# Patient Record
Sex: Male | Born: 1990 | Race: Black or African American | Hispanic: No | Marital: Single | Smoking: Never smoker
Health system: Southern US, Community
[De-identification: ages and names within clinical notes are randomized; demographics above are authoritative.]

## PROBLEM LIST (undated history)

## (undated) DIAGNOSIS — J302 Other seasonal allergic rhinitis: Secondary | ICD-10-CM

---
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
## 2011-11-05 ENCOUNTER — Encounter (HOSPITAL_COMMUNITY): Payer: Self-pay

## 2011-11-05 ENCOUNTER — Emergency Department (HOSPITAL_COMMUNITY)
Admission: EM | Admit: 2011-11-05 | Discharge: 2011-11-05 | Disposition: A | Discharge status: Home / Self Care | Payer: Managed Care, Other (non HMO) | Attending: Emergency Medicine | Admitting: Emergency Medicine

## 2011-11-05 DIAGNOSIS — J45909 Unspecified asthma, uncomplicated: Secondary | ICD-10-CM | POA: Insufficient documentation

## 2011-11-05 DIAGNOSIS — R5381 Other malaise: Secondary | ICD-10-CM | POA: Insufficient documentation

## 2011-11-05 DIAGNOSIS — R51 Headache: Secondary | ICD-10-CM | POA: Insufficient documentation

## 2011-11-05 DIAGNOSIS — J3489 Other specified disorders of nose and nasal sinuses: Secondary | ICD-10-CM | POA: Insufficient documentation

## 2011-11-05 DIAGNOSIS — J329 Chronic sinusitis, unspecified: Secondary | ICD-10-CM | POA: Insufficient documentation

## 2011-11-05 MED ORDER — AZITHROMYCIN 500 MG PO TABS: 500.0000 mg | ORAL_TABLET | Freq: Every day | ORAL | Status: AC

## 2011-11-05 NOTE — Discharge Instructions (Signed)
Please follow-up with a primary care physician:       Contact your insurance Counselling psychologist) and ask who participates with your insurance in the [place] area;       Call [facility] [phone]) to ask for physicians who participate with your [employer] insurance.  Konrad Felix RN, MSN, Case Manager          Sinusitis Sinuses are air pockets within the bones of your face. The growth of bacteria within a sinus leads to infection. The infection prevents the sinuses from draining. This infection is called sinusitis. SYMPTOMS  There will be different areas of pain depending on which sinuses have become infected.  The maxillary sinuses often produce pain beneath the eyes.   Frontal sinusitis may cause pain in the middle of the forehead and above the eyes.  Other problems (symptoms) include:  Toothaches.   Colored, pus-like (purulent) drainage from the nose.   Swelling, warmth, and tenderness over the sinus areas may be signs of infection.  TREATMENT  Sinusitis is most often determined by an exam.X-rays may be taken. If x-rays have been taken, make sure you obtain your results or find out how you are to obtain them. Your caregiver may give you medications (antibiotics). These are medications that will help kill the bacteria causing the infection. You may also be given a medication (decongestant) that helps to reduce sinus swelling.  HOME CARE INSTRUCTIONS   Only take over-the-counter or prescription medicines for pain, discomfort, or fever as directed by your caregiver.   Drink extra fluids. Fluids help thin the mucus so your sinuses can drain more easily.   Applying either moist heat or ice packs to the sinus areas may help relieve discomfort.   Use saline nasal sprays to help moisten your sinuses. The sprays can be found at your local drugstore.  SEEK IMMEDIATE MEDICAL CARE IF:  You have a fever.   You have increasing pain, severe headaches, or toothache.   You have nausea,  vomiting, or drowsiness.   You develop unusual swelling around the face or trouble seeing.  MAKE SURE YOU:   Understand these instructions.   Will watch your condition.   Will get help right away if you are not doing well or get worse.  Document Released: 07/09/2005 Document Revised: 06/28/2011 Document Reviewed: 02/05/2007 [facility] Patient Information 2012 [place], [place].

## 2011-11-05 NOTE — ED Notes (Signed)
Patient presents with nasal congestion x 2 weeks with weakness beginning yesterday. Patient denies cough, nausea, vomiting, diarrhea or SOB

## 2011-11-05 NOTE — Progress Notes (Signed)
Met with patient to discuss his health care practices. Patient states that he does not have a primary care physician; however, he does have  [place] insurance. Patient was offered the choice of me making an appointment or patient scheduling once he is discharged. Patient chose to make the contacts himself. I wrote instructions in the discharge.

## 2011-11-05 NOTE — ED Notes (Signed)
Orthostatics completed. Pt tolerated well. No complaint of dizziness. Pt states he told pa he had a little nausea yesterday

## 2011-11-05 NOTE — ED Provider Notes (Signed)
Medical screening examination/treatment/procedure(s) were performed by non-physician practitioner and as supervising physician I was immediately available for consultation/collaboration.   Joya Gaskins, MD 11/05/11 [phone]

## 2011-11-05 NOTE — ED Provider Notes (Signed)
History     CSN: [id number]  Arrival date & time 11/05/11  1033   First MD Initiated Contact with Patient 11/05/11 1100      Chief Complaint  Patient presents with  . Nasal Congestion    (Consider location/radiation/quality/duration/timing/severity/associated sxs/prior treatment) The history is provided by the patient.   the patient is a 21 year old male with a past medical history of asthma who presents to the emergency department with 2 weeks of nasal congestion with associated facial pressure and pain. He reports that yesterday, he began to have weakness described as feeling "woozy"when he stands only, improving after he has been standing for several minutes. He denies any fever, chills, ear pain, eye pain, change in vision, sore throat, cough, shortness of breath, chest pain, nausea, vomiting, diarrhea, abdominal pain. Nothing makes the symptoms better or worse. He has tried no medications for his symptoms and has had no prior medical evaluation.  Past Medical History  Diagnosis Date  . Asthma     History reviewed. No pertinent past surgical history.  No family history on file.  History  Substance Use Topics  . Smoking status: Current Everyday Smoker  . Smokeless tobacco: Not on file  . Alcohol Use: No      Review of Systems 10 systems reviewed and are negative for acute change except as noted in the HPI.  Allergies  Review of patient's allergies indicates no known allergies.  Home Medications  No current outpatient prescriptions on file.  BP 119/77  Pulse 96  Temp(Src) 98.8 F (37.1 C) (Oral)  Resp 16  SpO2 99%  Physical Exam  Nursing note and vitals reviewed. Constitutional: He is oriented to person, place, and time. He appears well-developed and well-nourished. No distress.  HENT:  Head: Normocephalic and atraumatic.  Right Ear: Hearing, tympanic membrane, external ear and ear canal normal.  Left Ear: Hearing, tympanic membrane, external ear and ear  canal normal.  Nose: Mucosal edema present. No sinus tenderness or septal deviation. Right sinus exhibits maxillary sinus tenderness. Right sinus exhibits no frontal sinus tenderness. Left sinus exhibits maxillary sinus tenderness. Left sinus exhibits no frontal sinus tenderness.  Mouth/Throat: Uvula is midline, oropharynx is clear and moist and mucous membranes are normal.  Eyes: Conjunctivae are normal. Pupils are equal, round, and reactive to light.  Neck: Normal range of motion. Neck supple.  Cardiovascular: Normal rate, regular rhythm, normal heart sounds and intact distal pulses.   No murmur heard. Pulmonary/Chest: Effort normal and breath sounds normal. No respiratory distress. He has no wheezes. He exhibits no tenderness.  Abdominal: Soft. He exhibits no distension. There is no tenderness.  Musculoskeletal: He exhibits no edema and no tenderness.  Lymphadenopathy:    He has no cervical adenopathy.  Neurological: He is alert and oriented to person, place, and time.  Skin: Skin is warm and dry.  Psychiatric: He has a normal mood and affect.    ED Course  Procedures (including critical care time)  Labs Reviewed - No data to display No results found.   Dx 1: Sinusitis   MDM  Pt with exam c/w sinusitis. Although not severe, duration of symptoms >10 days warrants abx treatment. Also discussed with pt the use of a decongestant, which he will try. Mild orthostasis, not significant enough to administer IV fluids.        Shaaron Adler, [place] 11/05/11 1304

## 2012-03-03 ENCOUNTER — Emergency Department (HOSPITAL_COMMUNITY)
Admission: EM | Admit: 2012-03-03 | Discharge: 2012-03-04 | Disposition: A | Discharge status: Home / Self Care | Payer: Managed Care, Other (non HMO) | Attending: Emergency Medicine | Admitting: Emergency Medicine

## 2012-03-03 ENCOUNTER — Emergency Department (HOSPITAL_COMMUNITY): Payer: Managed Care, Other (non HMO)

## 2012-03-03 ENCOUNTER — Encounter (HOSPITAL_COMMUNITY): Payer: Self-pay | Admitting: *Deleted

## 2012-03-03 DIAGNOSIS — Y9367 Activity, basketball: Secondary | ICD-10-CM | POA: Insufficient documentation

## 2012-03-03 DIAGNOSIS — M25539 Pain in unspecified wrist: Secondary | ICD-10-CM | POA: Insufficient documentation

## 2012-03-03 DIAGNOSIS — M25519 Pain in unspecified shoulder: Secondary | ICD-10-CM | POA: Insufficient documentation

## 2012-03-03 DIAGNOSIS — X58XXXA Exposure to other specified factors, initial encounter: Secondary | ICD-10-CM | POA: Insufficient documentation

## 2012-03-03 DIAGNOSIS — S63509A Unspecified sprain of unspecified wrist, initial encounter: Secondary | ICD-10-CM | POA: Insufficient documentation

## 2012-03-03 NOTE — ED Notes (Signed)
Pt states he was playing basketball, fell and landed on left arm. Pt c/o left shoulder, arm, and wrist pain. No obvious deformity noted. Bounding left radial pulse. Pt guarding left arm.

## 2012-03-04 MED ORDER — HYDROCODONE-ACETAMINOPHEN 5-500 MG PO TABS: 1.0000 | ORAL_TABLET | Freq: Four times a day (QID) | ORAL | Status: AC | PRN

## 2012-03-04 MED ORDER — OXYCODONE-ACETAMINOPHEN 5-325 MG PO TABS
1.0000 | ORAL_TABLET | Freq: Once | ORAL | Status: AC
  Administered 2012-03-04: 1 via ORAL
  Filled 2012-03-04: qty 1

## 2012-03-04 NOTE — ED Provider Notes (Signed)
Medical screening examination/treatment/procedure(s) were performed by non-physician practitioner and as supervising physician I was immediately available for consultation/collaboration.  Indy Kuck M Arrie Zuercher, MD 03/04/12 0822 

## 2012-03-04 NOTE — ED Provider Notes (Signed)
History     CSN: [id number]  Arrival date & time 03/03/12  2134   None     Chief Complaint  Patient presents with  . Arm Injury    (Consider location/radiation/quality/duration/timing/severity/associated sxs/prior treatment) HPI  Pt presents to the ER with complaints of left shoulder and hand injury. He says that he went up to dunk the basketball and something went wrong where he ended up falling on his side. He is able to move his shoulder without significant pain but states, his hand/wrist hurts really bad when he moves it. The patient denies head injury or LOC. He  Denies dizziness. No obvious deformity, pt is in NAD.  Past Medical History  Diagnosis Date  . Asthma     History reviewed. No pertinent past surgical history.  History reviewed. No pertinent family history.  History  Substance Use Topics  . Smoking status: Passive Smoker  . Smokeless tobacco: Not on file  . Alcohol Use: No      Review of Systems   HEENT: denies blurry vision or change in hearing PULMONARY: Denies difficulty breathing and SOB CARDIAC: denies chest pain or heart palpitations MUSCULOSKELETAL:  denies being unable to ambulate ABDOMEN AL: denies abdominal pain GU: denies loss of bowel or urinary control NEURO: denies numbness and tingling in extremities    Allergies  Review of patient's allergies indicates no known allergies.  Home Medications   Current Outpatient Rx  Name Route Sig Dispense Refill  . HYDROCODONE-ACETAMINOPHEN 5-500 MG PO TABS Oral Take 1 tablet by mouth every 6 (six) hours as needed for pain. 15 tablet 0    BP 119/73  Pulse 72  Temp 98.7 F (37.1 C) (Oral)  Resp 20  Wt 163 lb (73.936 kg)  SpO2 98%  Physical Exam  Nursing note and vitals reviewed. Constitutional: He appears well-developed and well-nourished. No distress.  HENT:  Head: Normocephalic and atraumatic.  Eyes: Pupils are equal, round, and reactive to light.  Neck: Normal range of motion.  Neck supple.  Cardiovascular: Normal rate and regular rhythm.   Pulmonary/Chest: Effort normal.  Abdominal: Soft.  Musculoskeletal:       Left shoulder: He exhibits tenderness (very mild tenderness to palpation of deltoid). He exhibits normal range of motion, no bony tenderness, no swelling, no effusion, no crepitus, no deformity, no laceration, no pain, no spasm, normal pulse and normal strength.       Left elbow: Normal.       Left wrist: He exhibits decreased range of motion (due to pain) and tenderness. He exhibits no swelling, no effusion, no crepitus, no deformity and no laceration.       Left hand: He exhibits tenderness (no snuff box tenderness). He exhibits normal range of motion, normal capillary refill, no deformity, no laceration and no swelling. normal sensation noted. Normal strength noted.  Neurological: He is alert.  Skin: Skin is warm and dry.    ED Course  Procedures (including critical care time)  Labs Reviewed - No data to display Dg Wrist Complete Left  03/03/2012  *RADIOLOGY REPORT*  Clinical Data: Pain post fall.  LEFT WRIST - COMPLETE 3+ VIEW  Comparison: None.  Findings: Carpal rows intact. Negative for fracture, dislocation, or other acute abnormality.  Normal alignment and mineralization. No significant degenerative change.  Regional soft tissues unremarkable.  IMPRESSION:  Negative  Original Report Authenticated By: Osa Craver, M.D.   Dg Shoulder Left  03/03/2012  *RADIOLOGY REPORT*  Clinical Data: Left arm injury.  Fall.  LEFT SHOULDER - 2+ VIEW  Comparison: None.  Findings: Internal and external rotation views of the left shoulder are normal.  The AC joint appears within normal limits without significant distraction.  Visualized left chest appears within normal limits.  There is no fracture.  Scapula appears normal.  IMPRESSION: Negative.  Original Report Authenticated By: Andreas Newport, M.D.     1. Wrist sprain       MDM  Pt given pain  medication and wrist splint in ED. Due to him being so tender I have recommended he follow-up up with the orthopedist. He was given a work-note for two days off. Pt advised to ice and  Take Ibuprofen for pain, swellign and healing.  Pt has been advised of the symptoms that warrant their return to the ED. Patient has voiced understanding and has agreed to follow-up with the PCP or specialist.         Dorthula Matas, PA 03/04/12 0028

## 2012-04-04 ENCOUNTER — Encounter (HOSPITAL_COMMUNITY): Payer: Self-pay | Admitting: Emergency Medicine

## 2012-04-04 ENCOUNTER — Emergency Department (HOSPITAL_COMMUNITY)
Admission: EM | Admit: 2012-04-04 | Discharge: 2012-04-04 | Disposition: A | Discharge status: Home / Self Care | Payer: Managed Care, Other (non HMO) | Attending: Emergency Medicine | Admitting: Emergency Medicine

## 2012-04-04 DIAGNOSIS — J45909 Unspecified asthma, uncomplicated: Secondary | ICD-10-CM | POA: Insufficient documentation

## 2012-04-04 DIAGNOSIS — Z833 Family history of diabetes mellitus: Secondary | ICD-10-CM | POA: Insufficient documentation

## 2012-04-04 DIAGNOSIS — J329 Chronic sinusitis, unspecified: Secondary | ICD-10-CM

## 2012-04-04 MED ORDER — IBUPROFEN 600 MG PO TABS: 600.0000 mg | ORAL_TABLET | Freq: Four times a day (QID) | ORAL | Status: AC | PRN

## 2012-04-04 MED ORDER — OXYMETAZOLINE HCL 0.05 % NA SOLN: 2.0000 | Freq: Two times a day (BID) | NASAL | Status: AC

## 2012-04-04 NOTE — ED Provider Notes (Signed)
History     CSN: [id number]  Arrival date & time 04/04/12  6578   First MD Initiated Contact with Patient 04/04/12 [phone]      Chief Complaint  Patient presents with  . Facial Pain    Sinus pressure x 2 days    (Consider location/radiation/quality/duration/timing/severity/associated sxs/prior treatment) HPI Comments: Patient complains of 2 days of dull facial pressure/apin and runny nose with sneezing. He denies headache, fever, ear pain, sore throat, cough. Patient has treated himself with saline nasal spray at home which has not helped. No other treatments prior to arrival. Patient gets upper respiratory tract infections frequently. Onset was gradual. Course is constant. Nothing makes symptoms better or worse. Pain does not radiate.  The history is provided by the patient.    Past Medical History  Diagnosis Date  . Asthma     History reviewed. No pertinent past surgical history.  Family History  Problem Relation Age of Onset  . Diabetes Mother     History  Substance Use Topics  . Smoking status: Never Smoker   . Smokeless tobacco: Not on file  . Alcohol Use: No      Review of Systems  Constitutional: Negative for fever, chills and fatigue.  HENT: Positive for congestion, rhinorrhea, sneezing and sinus pressure. Negative for ear pain, sore throat and neck stiffness.   Eyes: Negative for redness.  Respiratory: Negative for cough and wheezing.   Gastrointestinal: Negative for nausea, vomiting, abdominal pain and diarrhea.  Genitourinary: Negative for dysuria.  Musculoskeletal: Negative for myalgias.  Skin: Negative for rash.  Neurological: Negative for headaches.  Hematological: Negative for adenopathy.    Allergies  Review of patient's allergies indicates no known allergies.  Home Medications  No current outpatient prescriptions on file.  BP 118/75  Pulse 79  Temp 98.8 F (37.1 C) (Oral)  Resp 18  SpO2 99%  Physical Exam  Nursing note and vitals  reviewed. Constitutional: He appears well-developed and well-nourished.  HENT:  Head: Normocephalic and atraumatic.  Right Ear: Tympanic membrane, external ear and ear canal normal.  Left Ear: Tympanic membrane, external ear and ear canal normal.  Nose: Mucosal edema and rhinorrhea present. No nasal deformity. Right sinus exhibits maxillary sinus tenderness. Right sinus exhibits no frontal sinus tenderness. Left sinus exhibits no maxillary sinus tenderness and no frontal sinus tenderness.  Mouth/Throat: Uvula is midline, oropharynx is clear and moist and mucous membranes are normal.  Eyes: Conjunctivae normal are normal.  Neck: Normal range of motion. Neck supple.  Cardiovascular: Normal rate and regular rhythm.   No murmur heard. Pulmonary/Chest: Effort normal and breath sounds normal. No respiratory distress.  Neurological: He is alert.  Skin: Skin is warm and dry.  Psychiatric: He has a normal mood and affect.    ED Course  Procedures (including critical care time)  Labs Reviewed - No data to display No results found.   1. Sinusitis     10:12 AM Patient seen and examined. Counseled on conservative management for sinusitis.   Vital signs reviewed and are as follows: Filed Vitals:   04/04/12 0944  BP: 118/75  Pulse: 79  Temp: 98.8 F (37.1 C)  Resp: 18     MDM  Patient with signs and symptoms consistent with sinusitis for 2 days. Patient does have some unilateral face pain. No systemic symptoms such as fever or vomiting. Patient appears well. Will treat conservatively with nasal decongestants and anti-inflammatory medications.       Renne Crigler, [place] 04/04/12 [phone]

## 2012-04-04 NOTE — ED Notes (Signed)
C/o pressure ib r/nostril x 2 days. pressure unresponsive to saline spray

## 2012-04-05 NOTE — ED Provider Notes (Signed)
Medical screening examination/treatment/procedure(s) were performed by non-physician practitioner and as supervising physician I was immediately available for consultation/collaboration.   Gavin Pound. Shantrell Placzek, MD 04/05/12 0865

## 2012-09-17 ENCOUNTER — Emergency Department (HOSPITAL_COMMUNITY)
Admission: EM | Admit: 2012-09-17 | Discharge: 2012-09-17 | Disposition: A | Discharge status: Home / Self Care | Payer: BC Managed Care – PPO | Attending: Emergency Medicine | Admitting: Emergency Medicine

## 2012-09-17 ENCOUNTER — Encounter (HOSPITAL_COMMUNITY): Payer: Self-pay | Admitting: *Deleted

## 2012-09-17 DIAGNOSIS — Y990 Civilian activity done for income or pay: Secondary | ICD-10-CM | POA: Insufficient documentation

## 2012-09-17 DIAGNOSIS — J45909 Unspecified asthma, uncomplicated: Secondary | ICD-10-CM | POA: Insufficient documentation

## 2012-09-17 DIAGNOSIS — IMO0002 Reserved for concepts with insufficient information to code with codable children: Secondary | ICD-10-CM | POA: Insufficient documentation

## 2012-09-17 DIAGNOSIS — X500XXA Overexertion from strenuous movement or load, initial encounter: Secondary | ICD-10-CM | POA: Insufficient documentation

## 2012-09-17 DIAGNOSIS — Y939 Activity, unspecified: Secondary | ICD-10-CM | POA: Insufficient documentation

## 2012-09-17 DIAGNOSIS — Y9289 Other specified places as the place of occurrence of the external cause: Secondary | ICD-10-CM | POA: Insufficient documentation

## 2012-09-17 DIAGNOSIS — M549 Dorsalgia, unspecified: Secondary | ICD-10-CM

## 2012-09-17 LAB — URINALYSIS, ROUTINE W REFLEX MICROSCOPIC
Bilirubin Urine: NEGATIVE
Glucose, UA: NEGATIVE mg/dL
Ketones, ur: NEGATIVE mg/dL
Protein, ur: NEGATIVE mg/dL

## 2012-09-17 MED ORDER — NAPROXEN 500 MG PO TABS: 500.0000 mg | ORAL_TABLET | Freq: Two times a day (BID) | ORAL | Status: DC

## 2012-09-17 NOTE — ED Provider Notes (Signed)
History  This chart was scribed for non-physician practitioner working with Richardean Canal, MD by Ardeen Jourdain, ED Scribe. This patient was seen in room TR08C/TR08C and the patient's care was started at 1834.  CSN: [id number]  Arrival date & time 09/17/12  1759   None     Chief Complaint  Patient presents with  . Back Pain     Patient is a 22 y.o. male presenting with back pain. The history is provided by the patient. No language interpreter was used.  Back Pain Location:  Lumbar spine Quality:  Stabbing, cramping and stiffness Stiffness is present:  In the morning Radiates to:  Does not radiate Pain severity:  Mild Onset quality:  Sudden Timing:  Constant Progression:  Worsening Chronicity:  Chronic Relieved by:  Being still and NSAIDs Worsened by:  Twisting, bending and lying down Ineffective treatments:  None tried Associated symptoms: no abdominal pain, no abdominal swelling, no bladder incontinence, no chest pain, no fever, no headaches, no leg pain, no numbness, no tingling and no weakness     Benjamin Hudson is a 22 y.o. male with a h/o lower back pain who presents to the Emergency Department complaining of gradually worsening left lower flank pain that began this morning. He states he noticed the pain when he was standing up from bed. He reports at work he was bending over when he experienced a very sharp pain. He states his pain is aggravated by movement. He denies any nausea, emesis, fever, hematuria, dysuria or other urinary changes. He reports taking aleve with some relief.    Past Medical History  Diagnosis Date  . Asthma     History reviewed. No pertinent past surgical history.  Family History  Problem Relation Age of Onset  . Diabetes Mother     History  Substance Use Topics  . Smoking status: Never Smoker   . Smokeless tobacco: Not on file  . Alcohol Use: No      Review of Systems  Constitutional: Negative for fever.  Cardiovascular: Negative  for chest pain.  Gastrointestinal: Negative for abdominal pain.  Genitourinary: Negative for bladder incontinence.  Musculoskeletal: Positive for back pain.  Neurological: Negative for tingling, weakness, numbness and headaches.  All other systems reviewed and are negative.    Allergies  Review of patient's allergies indicates no known allergies.  Home Medications  No current outpatient prescriptions on file.  Triage Vitals: BP 105/76  Pulse 93  Temp(Src) 98.6 F (37 C) (Oral)  Resp 16  SpO2 97%  Physical Exam  Nursing note and vitals reviewed. Constitutional: He is oriented to person, place, and time. He appears well-developed and well-nourished. No distress.  HENT:  Head: Normocephalic and atraumatic.  Eyes: EOM are normal. Pupils are equal, round, and reactive to light.  Neck: Normal range of motion. Neck supple. No tracheal deviation present.  Cardiovascular: Normal rate.   Pulmonary/Chest: Effort normal. No respiratory distress.  Abdominal: Soft. He exhibits no distension.  Musculoskeletal: Normal range of motion. He exhibits tenderness. He exhibits no edema.  Left flank tenderness   Neurological: He is alert and oriented to person, place, and time.  Skin: Skin is warm and dry.  Psychiatric: He has a normal mood and affect. His behavior is normal.    ED Course  Procedures (including critical care time)  DIAGNOSTIC STUDIES: Oxygen Saturation is 97% on room air, normal by my interpretation.    COORDINATION OF CARE:  6:37 PM: Discussed treatment plan which includes UA  and pain medication with pt at bedside and pt agreed to plan.     Labs Reviewed - No data to display No results found.   No diagnosis found.  Urinalysis results reviewed, are normal, shared with patient.  Left flank pain, likely of muscular origin.  MDM    I personally performed the services described in this documentation, which was scribed in my presence. The recorded information has  been reviewed and is accurate.        Jimmye Norman, NP 09/18/12 [phone]

## 2012-09-17 NOTE — ED Notes (Signed)
Pt with bil lower back pain x 1 year.  However, today when he got out of bed he noticed L lower back pain.  At work when he bent over he experienced very sharp pain.  Pain increases with movement.

## 2012-09-18 NOTE — ED Provider Notes (Signed)
Medical screening examination/treatment/procedure(s) were performed by non-physician practitioner and as supervising physician I was immediately available for consultation/collaboration.   Alexa Golebiewski H Neco Kling, MD 09/18/12 1458 

## 2013-04-21 ENCOUNTER — Encounter (HOSPITAL_COMMUNITY): Payer: Self-pay | Admitting: *Deleted

## 2013-04-21 ENCOUNTER — Emergency Department (HOSPITAL_COMMUNITY)
Admission: EM | Admit: 2013-04-21 | Discharge: 2013-04-21 | Disposition: A | Discharge status: Home / Self Care | Payer: BC Managed Care – PPO | Attending: Emergency Medicine | Admitting: Emergency Medicine

## 2013-04-21 DIAGNOSIS — R259 Unspecified abnormal involuntary movements: Secondary | ICD-10-CM | POA: Insufficient documentation

## 2013-04-21 DIAGNOSIS — J45909 Unspecified asthma, uncomplicated: Secondary | ICD-10-CM | POA: Insufficient documentation

## 2013-04-21 DIAGNOSIS — R251 Tremor, unspecified: Secondary | ICD-10-CM

## 2013-04-21 LAB — BASIC METABOLIC PANEL
BUN: 14 mg/dL (ref 6–23)
CO2: 26 mEq/L (ref 19–32)
Chloride: 101 mEq/L (ref 96–112)
Glucose, Bld: 99 mg/dL (ref 70–99)
Sodium: 136 mEq/L (ref 135–145)

## 2013-04-21 LAB — CBC
HCT: 43.1 % (ref 39.0–52.0)
MCH: 31.8 pg (ref 26.0–34.0)
MCHC: 35.7 g/dL (ref 30.0–36.0)
RBC: 4.84 MIL/uL (ref 4.22–5.81)
RDW: 12.6 % (ref 11.5–15.5)

## 2013-04-21 NOTE — ED Provider Notes (Signed)
Medical screening examination/treatment/procedure(s) were performed by non-physician practitioner and as supervising physician I was immediately available for consultation/collaboration. Devoria Albe, MD, Armando Gang   Ward Givens, MD 04/21/13 1245

## 2013-04-21 NOTE — ED Notes (Signed)
Reports lying in bed this am and remembers shaking and "everything going black for what felt like 10 seconds." pts friend thinks he had a seizure, no hx of seizures. ekg done at triage, no acute distress noted.

## 2013-04-21 NOTE — ED Provider Notes (Signed)
CSN: [id number]     Arrival date & time 04/21/13  1006 History   First MD Initiated Contact with Patient 04/21/13 1107     Chief Complaint  Patient presents with  . Seizures   (Consider location/radiation/quality/duration/timing/severity/associated sxs/prior Treatment) HPI  22 year old male with no prior history of seizure was brought here via friend for evaluation of a possible seizure episode. History obtained through patient and through friend who was at bedside. Patient states he hasn't had enough sleep Lately and has been working late. This morning while sleeping in bed he remembered feeling a sensation of body tremors, was trying to wake up but unable to open his eyes.  Felt like sxs lasting for about 10 seconds.  He can recall most of the event.  He did not have a postictal sts.  Denies any residual headache, confusion, tongue biting, urinary/bowel incontinence or any numbness or weakness.  He is at his baseline.  His friend recall sleeping next to pt, felt his body tremor, and tries to wake him up for what seems to be 5 minutes but he did not wake up... patient states he has no history of chronic disease. He is not a smoker, denies any recent alcohol or recreational drug use. Denies taking any medication on a regular basis. No other complaints.  Past Medical History  Diagnosis Date  . Asthma    History reviewed. No pertinent past surgical history. Family History  Problem Relation Age of Onset  . Diabetes Mother    History  Substance Use Topics  . Smoking status: Never Smoker   . Smokeless tobacco: Not on file  . Alcohol Use: No    Review of Systems  Constitutional: Negative for fever.  HENT: Negative for neck pain.   Respiratory: Negative for chest tightness and shortness of breath.   Cardiovascular: Negative for chest pain.  Skin: Negative for rash.  Neurological: Positive for tremors. Negative for speech difficulty, numbness and headaches.  All other systems reviewed and  are negative.    Allergies  Review of patient's allergies indicates no known allergies.  Home Medications  No current outpatient prescriptions on file. BP 119/69  Pulse 66  Temp(Src) 98.2 F (36.8 C) (Oral)  Resp 19  SpO2 100% Physical Exam  Nursing note and vitals reviewed. Constitutional: He appears well-developed and well-nourished. No distress.  Awake, alert, nontoxic appearance  HENT:  Head: Atraumatic.  Eyes: Conjunctivae and EOM are normal. Pupils are equal, round, and reactive to light. Right eye exhibits no discharge. Left eye exhibits no discharge.  Neck: Normal range of motion. Neck supple.  Cardiovascular: Normal rate and regular rhythm.   Pulmonary/Chest: Effort normal. No respiratory distress. He exhibits no tenderness.  Abdominal: Soft. There is no tenderness. There is no rebound.  Musculoskeletal: He exhibits no tenderness.  ROM appears intact, no obvious focal weakness  Neurological: He is alert.   A&O x 3, sppech clear, cognition appears to be normal, CN III-XII grossly normal - face symmetric, no deviation tongue, PERRLA EOMI, normal shoulder shrug; MS 5/5 throughout; DTRs 2+ and symmetrical; cerebellar - normal finger-nose, heel-shin, rapid alternating finger movement, nl gait; cerebellar - no tremor, no cogwheeling, normal gait   Skin: Skin is warm and dry. No rash noted.  Psychiatric: He has a normal mood and affect.    ED Course  Procedures (including critical care time)   Date: 04/21/2013  Rate: 73  Rhythm: normal sinus rhythm with sinus arrythmia  QRS Axis: normal  Intervals: normal  ST/T Wave abnormalities: early repolarization  Conduction Disutrbances:none  Narrative Interpretation: LVH  Old EKG Reviewed: none available  11:41 AM Pt here for evaluation of seizure.  Sxs not suggestive of seizure.  Pt is back to baseline.  I discussed with pt i do not suspect seizure activitiy, but can perform full work up today.  Pt declined.  I recommend  prompt return if sxs happened again. Will also give referral to neurology as needed. However, i felt his sxs likely related to being fatigue and was in an REM state this AM while sleeping.  Sxs however can also be related to being sleep deprived.  Therefore, recommend no driving and close f/u with neurologist for EEG study.  Care discussed with attending.  Pt agrees with plan.     Labs Review Labs Reviewed  CBC - Abnormal; Notable for the following:    WBC 3.4 (*)    All other components within normal limits  BASIC METABOLIC PANEL   Imaging Review No results found.  MDM   1. Tremor    BP 119/69  Pulse 66  Temp(Src) 98.2 F (36.8 C) (Oral)  Resp 19  SpO2 100%     Fayrene Helper, PA-C 04/21/13 1237

## 2013-04-21 NOTE — Discharge Instructions (Signed)
Please follow up with neurologist for an EEG study to rule out seizure.  Your episode may be related to being sleep deprived.  Avoid driving until cleared by your neurologist for your safety.  Return to ER if your symptoms is recurrent or if you have other concerns.     Tremor Tremor is a rhythmic, involuntary muscular contraction characterized by oscillations (to-and-fro movements) of a part of the body. The most common of all involuntary movements, tremor can affect various body parts such as the hands, head, facial structures, vocal cords, trunk, and legs; most tremors, however, occur in the hands. Tremor often accompanies neurological disorders associated with aging. Although the disorder is not life-threatening, it can be responsible for functional disability and social embarrassment. TREATMENT  There are many types of tremor and several ways in which tremor is classified. The most common classification is by behavioral context or position. There are five categories of tremor within this classification: resting, postural, kinetic, task-specific, and psychogenic. Resting or static tremor occurs when the muscle is at rest, for example when the hands are lying on the lap. This type of tremor is often seen in patients with Parkinson's disease. Postural tremor occurs when a patient attempts to maintain posture, such as holding the hands outstretched. Postural tremors include physiological tremor, essential tremor, tremor with basal ganglia disease (also seen in patients with Parkinson's disease), cerebellar postural tremor, tremor with peripheral neuropathy, post-traumatic tremor, and alcoholic tremor. Kinetic or intention (action) tremor occurs during purposeful movement, for example during finger-to-nose testing. Task-specific tremor appears when performing goal-oriented tasks such as handwriting, speaking, or standing. This group consists of primary writing tremor, vocal tremor, and orthostatic tremor.  Psychogenic tremor occurs in both older and younger patients. The key feature of this tremor is that it dramatically lessens or disappears when the patient is distracted. PROGNOSIS There are some treatment options available for tremor; the appropriate treatment depends on accurate diagnosis of the cause. Some tremors respond to treatment of the underlying condition, for example in some cases of psychogenic tremor treating the patient's underlying mental problem may cause the tremor to disappear. Also, patients with tremor due to Parkinson's disease may be treated with Levodopa drug therapy. Symptomatic drug therapy is available for several other tremors as well. For those cases of tremor in which there is no effective drug treatment, physical measures such as teaching the patient to brace the affected limb during the tremor are sometimes useful. Surgical intervention such as thalamotomy or deep brain stimulation may be useful in certain cases. Document Released: 06/29/2002 Document Revised: 10/01/2011 Document Reviewed: 07/09/2005 [facility] Patient Information 2014 [place], [place].

## 2013-09-30 ENCOUNTER — Telehealth (HOSPITAL_COMMUNITY): Payer: Self-pay

## 2013-09-30 ENCOUNTER — Encounter (HOSPITAL_COMMUNITY): Payer: Self-pay | Admitting: Emergency Medicine

## 2013-09-30 ENCOUNTER — Emergency Department (HOSPITAL_COMMUNITY)
Admission: EM | Admit: 2013-09-30 | Discharge: 2013-09-30 | Disposition: A | Discharge status: Home / Self Care | Payer: 59 | Attending: Emergency Medicine | Admitting: Emergency Medicine

## 2013-09-30 DIAGNOSIS — J45909 Unspecified asthma, uncomplicated: Secondary | ICD-10-CM | POA: Insufficient documentation

## 2013-09-30 DIAGNOSIS — M545 Low back pain, unspecified: Secondary | ICD-10-CM | POA: Insufficient documentation

## 2013-09-30 MED ORDER — CYCLOBENZAPRINE HCL 10 MG PO TABS: 10.0000 mg | ORAL_TABLET | Freq: Two times a day (BID) | ORAL | Status: DC | PRN

## 2013-09-30 MED ORDER — IBUPROFEN 800 MG PO TABS: 800.0000 mg | ORAL_TABLET | Freq: Three times a day (TID) | ORAL | Status: DC | PRN

## 2013-09-30 NOTE — ED Provider Notes (Signed)
Medical screening examination/treatment/procedure(s) were performed by non-physician practitioner and as supervising physician I was immediately available for consultation/collaboration.   EKG Interpretation None      Devoria AlbeIva Mellanie Bejarano, MD, Armando GangFACEP   Ward GivensIva L Jeramy Dimmick, MD 09/30/13 1016

## 2013-09-30 NOTE — Discharge Instructions (Signed)
Read the information below.  Use the prescribed medication as directed.  Please discuss all new medications with your pharmacist.  You may return to the Emergency Department at any time for worsening condition or any new symptoms that concern you.     If you develop fevers, loss of control of bowel or bladder, weakness or numbness in your legs, or are unable to walk, return to the ER for a recheck.    Back Exercises Back exercises help treat and prevent back injuries. The goal of back exercises is to increase the strength of your abdominal and back muscles and the flexibility of your back. These exercises should be started when you no longer have back pain. Back exercises include:  Pelvic Tilt. Lie on your back with your knees bent. Tilt your pelvis until the lower part of your back is against the floor. Hold this position 5 to 10 sec and repeat 5 to 10 times.  Knee to Chest. Pull first 1 knee up against your chest and hold for 20 to 30 seconds, repeat this with the other knee, and then both knees. This may be done with the other leg straight or bent, whichever feels better.  Sit-Ups or Curl-Ups. Bend your knees 90 degrees. Start with tilting your pelvis, and do a partial, slow sit-up, lifting your trunk only 30 to 45 degrees off the floor. Take at least 2 to 3 seconds for each sit-up. Do not do sit-ups with your knees out straight. If partial sit-ups are difficult, simply do the above but with only tightening your abdominal muscles and holding it as directed.  Hip-Lift. Lie on your back with your knees flexed 90 degrees. Push down with your feet and shoulders as you raise your hips a couple inches off the floor; hold for 10 seconds, repeat 5 to 10 times.  Back arches. Lie on your stomach, propping yourself up on bent elbows. Slowly press on your hands, causing an arch in your low back. Repeat 3 to 5 times. Any initial stiffness and discomfort should lessen with repetition over time.  Shoulder-Lifts.  Lie face down with arms beside your body. Keep hips and torso pressed to floor as you slowly lift your head and shoulders off the floor. Do not overdo your exercises, especially in the beginning. Exercises may cause you some mild back discomfort which lasts for a few minutes; however, if the pain is more severe, or lasts for more than 15 minutes, do not continue exercises until you see your caregiver. Improvement with exercise therapy for back problems is slow.  See your caregivers for assistance with developing a proper back exercise program. Document Released: 08/16/2004 Document Revised: 10/01/2011 Document Reviewed: 05/10/2011 [facility] Patient Information 2014 [place].  Back Injury Prevention Back injuries can be extremely painful and difficult to heal. After having one back injury, you are much more likely to experience another later on. It is important to learn how to avoid injuring or re-injuring your back. The following tips can help you to prevent a back injury. PHYSICAL FITNESS  Exercise regularly and try to develop good tone in your abdominal muscles. Your abdominal muscles provide a lot of the support needed by your back.  Do aerobic exercises (walking, jogging, biking, swimming) regularly.  Do exercises that increase balance and strength (tai chi, yoga) regularly. This can decrease your risk of falling and injuring your back.  Stretch before and after exercising.  Maintain a healthy weight. The more you weigh, the more stress is  placed on your back. For every pound of weight, 10 times that amount of pressure is placed on the back. DIET  Talk to your caregiver about how much calcium and vitamin D you need per day. These nutrients help to prevent weakening of the bones (osteoporosis). Osteoporosis can cause broken (fractured) bones that lead to back pain.  Include good sources of calcium in your diet, such as dairy products, green, leafy vegetables, and products with calcium  added (fortified).  Include good sources of vitamin D in your diet, such as milk and foods that are fortified with vitamin D.  Consider taking a nutritional supplement or a multivitamin if needed.  Stop smoking if you smoke. POSTURE  Sit and stand up straight. Avoid leaning forward when you sit or hunching over when you stand.  Choose chairs with good low back (lumbar) support.  If you work at a desk, sit close to your work so you do not need to lean over. Keep your chin tucked in. Keep your neck drawn back and elbows bent at a right angle. Your arms should look like the letter "L."  Sit high and close to the steering wheel when you drive. Add a lumbar support to your car seat if needed.  Avoid sitting or standing in one position for too long. Take breaks to get up, stretch, and walk around at least once every hour. Take breaks if you are driving for long periods of time.  Sleep on your side with your knees slightly bent, or sleep on your back with a pillow under your knees. Do not sleep on your stomach. LIFTING, TWISTING, AND REACHING  Avoid heavy lifting, especially repetitive lifting. If you must do heavy lifting:  Stretch before lifting.  Work slowly.  Rest between lifts.  Use carts and dollies to move objects when possible.  Make several small trips instead of carrying 1 heavy load.  Ask for help when you need it.  Ask for help when moving big, awkward objects.  Follow these steps when lifting:  Stand with your feet shoulder-width apart.  Get as close to the object as you can. Do not try to pick up heavy objects that are far from your body.  Use handles or lifting straps if they are available.  Bend at your knees. Squat down, but keep your heels off the floor.  Keep your shoulders pulled back, your chin tucked in, and your back straight.  Lift the object slowly, tightening the muscles in your legs, abdomen, and buttocks. Keep the object as close to the center of  your body as possible.  When you put a load down, use these same guidelines in reverse.  Do not:  Lift the object above your waist.  Twist at the waist while lifting or carrying a load. Move your feet if you need to turn, not your waist.  Bend over without bending at your knees.  Avoid reaching over your head, across a table, or for an object on a high surface. OTHER TIPS  Avoid wet floors and keep sidewalks clear of ice to prevent falls.  Do not sleep on a mattress that is too soft or too hard.  Keep items that are used frequently within easy reach.  Put heavier objects on shelves at waist level and lighter objects on lower or higher shelves.  Find ways to decrease your stress, such as exercise, massage, or relaxation techniques. Stress can build up in your muscles. Tense muscles are more vulnerable to injury.  Seek treatment for depression or anxiety if needed. These conditions can increase your risk of developing back pain. SEEK MEDICAL CARE IF:  You injure your back.  You have questions about diet, exercise, or other ways to prevent back injuries. MAKE SURE YOU:  Understand these instructions.  Will watch your condition.  Will get help right away if you are not doing well or get worse. Document Released: 08/16/2004 Document Revised: 10/01/2011 Document Reviewed: 08/20/2011 [facility] Patient Information 2014 [place], [place].  Back Pain, Adult Low back pain is very common. About 1 in 5 people have back pain.The cause of low back pain is rarely dangerous. The pain often gets better over time.About half of people with a sudden onset of back pain feel better in just 2 weeks. About 8 in 10 people feel better by 6 weeks.  CAUSES Some common causes of back pain include:  Strain of the muscles or ligaments supporting the spine.  Wear and tear (degeneration) of the spinal discs.  Arthritis.  Direct injury to the back. DIAGNOSIS Most of the time, the direct cause of  low back pain is not known.However, back pain can be treated effectively even when the exact cause of the pain is unknown.Answering your caregiver's questions about your overall health and symptoms is one of the most accurate ways to make sure the cause of your pain is not dangerous. If your caregiver needs more information, he or she may order lab work or imaging tests (X-rays or MRIs).However, even if imaging tests show changes in your back, this usually does not require surgery. HOME CARE INSTRUCTIONS For many people, back pain returns.Since low back pain is rarely dangerous, it is often a condition that people can learn to [facility] their own.   Remain active. It is stressful on the back to sit or stand in one place. Do not sit, drive, or stand in one place for more than 30 minutes at a time. Take short walks on level surfaces as soon as pain allows.Try to increase the length of time you walk each day.  Do not stay in bed.Resting more than 1 or 2 days can delay your recovery.  Do not avoid exercise or work.Your body is made to move.It is not dangerous to be active, even though your back may hurt.Your back will likely heal faster if you return to being active before your pain is gone.  Pay attention to your body when you bend and lift. Many people have less discomfortwhen lifting if they bend their knees, keep the load close to their bodies,and avoid twisting. Often, the most comfortable positions are those that put less stress on your recovering back.  Find a comfortable position to sleep. Use a firm mattress and lie on your side with your knees slightly bent. If you lie on your back, put a pillow under your knees.  Only take over-the-counter or prescription medicines as directed by your caregiver. Over-the-counter medicines to reduce pain and inflammation are often the most helpful.Your caregiver may prescribe muscle relaxant drugs.These medicines help dull your pain so you can more  quickly return to your normal activities and healthy exercise.  Put ice on the injured area.  Put ice in a plastic bag.  Place a towel between your skin and the bag.  Leave the ice on for 15-20 minutes, 03-04 times a day for the first 2 to 3 days. After that, ice and heat may be alternated to reduce pain and spasms.  Ask your caregiver about trying  back exercises and gentle massage. This may be of some benefit.  Avoid feeling anxious or stressed.Stress increases muscle tension and can worsen back pain.It is important to recognize when you are anxious or stressed and learn ways to manage it.Exercise is a great option. SEEK MEDICAL CARE IF:  You have pain that is not relieved with rest or medicine.  You have pain that does not improve in 1 week.  You have new symptoms.  You are generally not feeling well. SEEK IMMEDIATE MEDICAL CARE IF:   You have pain that radiates from your back into your legs.  You develop new bowel or bladder control problems.  You have unusual weakness or numbness in your arms or legs.  You develop nausea or vomiting.  You develop abdominal pain.  You feel faint. Document Released: 07/09/2005 Document Revised: 01/08/2012 Document Reviewed: 11/27/2010 [facility] Patient Information 2014 [place], [place].   Emergency Department Resource Guide 1) Find a Doctor and Pay Out of Pocket Although you won't have to find out who is covered by your insurance plan, it is a good idea to ask around and get recommendations. You will then need to call the office and see if the doctor you have chosen will accept you as a new patient and what types of options they offer for patients who are self-pay. Some doctors offer discounts or will set up payment plans for their patients who do not have insurance, but you will need to ask so you aren't surprised when you get to your appointment.  2) Contact Your Local Health Department Not all health departments have doctors that can  see patients for sick visits, but many do, so it is worth a call to see if yours does. If you don't know where your local health department is, you can check in your phone book. The CDC also has a tool to help you locate your state's health department, and many state websites also have listings of all of their local health departments.  3) Find a [facility] If your illness is not likely to be very severe or complicated, you may want to try a walk in clinic. These are popping up all over the country in pharmacies, drugstores, and shopping centers. They're usually staffed by nurse practitioners or physician assistants that have been trained to treat common illnesses and complaints. They're usually fairly quick and inexpensive. However, if you have serious medical issues or chronic medical problems, these are probably not your best option.  No Primary Care Doctor: - Call Health Connect at  [phone] - they can help you locate a primary care doctor that  accepts your insurance, provides certain services, etc. - Physician Referral Service- [phone]  Chronic Pain Problems: Organization         Address  Phone   Notes  [facility]  [phone] Patients need to be referred by their primary care doctor.   Medication Assistance: Organization         Address  Phone   Notes  [facility] Medication [facility] [address] [place], [place] [address] [phone] --Must be a resident of [facility] -- Must have NO insurance coverage whatsoever (no Medicaid/ Medicare, etc.) -- The pt. MUST have a primary care doctor that directs their care regularly and follows them in the community   MedAssist  [phone]   [employer]  [phone]    Agencies that provide inexpensive medical care: Organization  Address  Phone   Notes  [place]  [phone]   [facility]    [phone]   [facility] [place], [place] [address] [phone]   [place] [address] [place] [place] [phone]   Planned Parenthood    [phone]   [facility]    [phone]   [place] and [place] [address], [place] Phone:  [phone], Fax:  [phone] Hours of Operation:  9 am - 6 pm, M-F.  Also accepts Medicaid/Medicare and self-pay.  [facility] for [place], [address], [place] Phone: [phone], Fax: [phone]. Hours of Operation:  8:30 am - 5:30 pm, M-F.  Also accepts Medicaid and self-pay.  [facility] [place] [address], [place] Phone: [phone]   [place], [place], [place] [phone], Ext. [phone] Mondays & Thursdays: 7-9 AM.  First 15 patients are seen on a first come, first serve basis.    [place] Providers:  Organization         Address  Phone   Notes  [facility] [address], [place] [phone] Also accepts self-pay patients.  [facility] [address], [place]  [phone]   [place], [address], [place] [phone]   Aayden Cefalu [facility] [address], [place] [phone]   Lucianne Lei [address], [place]   [phone] Only accepts [place] Access [place] patients after they have their name applied to their card.   Self-Pay (no insurance) in [facility]:  Organization         Address  Phone   Notes  Sickle Cell Patients, [facility] [place] [phone]   [facility] Urgent Care [place] [phone]   [facility] [place]  [place], [place], [place] [phone]   [facility]/Dr. Osei-Bonsu   [address] [place] or [address], [place] [phone] Phone number for both [place] and [place] locations is the same.  Urgent Medical and [facility] [address], [place] [phone]   [facility] Halena Mohar [address], [place] or [address] [phone]   [facility] [address] [place] [phone], phone; [phone], fax Sees patients 1st and 3rd Saturday of every month.  Must not qualify for public or private insurance (i.e. Medicaid, Medicare, [place] Health Choice, Veterans' Benefits)  Household income should be no more than 200% of the poverty level The clinic cannot treat you if you are pregnant or think you are pregnant  Sexually transmitted diseases are not treated at the clinic.    Dental Care: Organization         Address  Phone  Notes  [facility] Department of [facility] [place] [phone] Accepts children up to age 79 who are enrolled in [place] or [place]; pregnant women with a Medicaid card; and children who have applied for Medicaid or [place] Health Choice, but were declined, whose parents can pay a reduced fee at time  of service.  [facility] Department of [facility]  [address], [place] [phone] Accepts children up to age 52 who are enrolled in [place] or [place]; pregnant women with a Medicaid card; and children who have applied for Medicaid or [place] Health Choice, but were declined, whose parents can pay a reduced fee at time of service.  [place] Adult Dental Access PROGRAM  Mahala Rommel [place] [phone] Patients are seen by appointment only. Walk-ins are not accepted. [place] will see patients 19 years of age and older. Monday - Tuesday (8am-5pm) Most Wednesdays (8:30-5pm) $30 per visit, cash only  [facility] Adult Dental Access  PROGRAM  [address], [facility] [phone] Patients are seen by appointment only. Walk-ins are not accepted. [place] will see patients 57 years of age and older. One Wednesday Evening (Monthly: Volunteer Based).  $30 per visit, cash only  [place]  [phone] for adults; Children under age 41, call Graduate Pediatric Dentistry at [phone]. Children aged 32-14, please call [phone] to request a pediatric application.  Dental services are provided in all areas of dental care including fillings, crowns and bridges, complete and partial dentures, implants, gum treatment, root canals, and extractions. Preventive care is also provided. Treatment is provided to both adults and children. Patients are selected via a lottery and there is often a waiting list.   [facility] [address], [place]  [phone] [email or web address]   [facility] [address] [place], [place] [phone], Ext. [phone] Second and Fourth Thursday of each month, opens at 6:30 AM; Clinic ends at 9 AM.  Patients are seen on a first-come first-served basis, and a limited number are seen during each clinic.   [facility]  [address] Hillard Danker [place], [place] [phone]   Eligibility Requirements You must have lived in [place], [place], or [place] counties for at least the last three months.   You cannot be eligible for state or federal sponsored [employer], including [employer], [place], or [employer].   You generally cannot be eligible for healthcare insurance through your employer.    How to apply: Eligibility screenings are held every Tuesday and Wednesday afternoon from 1:00 pm until 4:00 pm. You do not need an appointment for the interview!  [facility] [address] [place], [place]   [place]  [place] Department   [place]  [phone]    Behavioral Health Resources in the Community: Intensive Outpatient Programs Organization         Address  Phone  Notes  [place]. [address] [place], [place] [phone]   [facility] Outpatient [address], [place], [place]   ADS: Alcohol & Drug Svcs [address], [place], [place]   [place] [address],  [place], [place] or [phone]   Substance Abuse Resources Organization         Address  Phone  Notes  Alcohol and Drug Services  [phone]   [place]  [phone]   [facility] [place]   Chinita Pester  [phone]   Residential & Outpatient Substance Abuse Program  [phone]   Psychological Services Organization         Address  Phone  Notes  [facility] [place]  [place]  [phone]   [place] [address], [place] or [phone]    Mobile Crisis Teams Organization         Address  Phone  Notes  [facility], Mobile Crisis Care Unit  [phone]   [facility]  [address] [place], [place]   [place] Levels [address], [place] [phone]    Self-Help/Support Groups Organization         Address  Phone             Notes  [place]. of [place] - variety of support groups  [place] Call for more information  Narcotics Anonymous (NA), Caring Services [address], [employer] [place]  2 meetings at this location   Special educational needs teacher         Address  Phone  Notes  ASAP Residential Treatment [facility],    [place]  [phone]   [facility]  [address] [place] [id number], [place], [place]   [place], [place] [phone] Admissions: 8am-3pm M-F  Incentives Substance [place] [address]    [place], [place] [phone]   [facility] [address] [place], [place], [place]   [facility] [address],  [place], [place]   [facility] Programs - Intensive Outpatient [address], [place], [place]   [facility] ([place].) [place].,  [place], [place] [phone] or [phone]   [facility] Services (RTS) [address], [place], [place] Accepts Medicaid  [facility] [place] [address]  [place] [phone] Substance Abuse/Addiction Treatment   [facility] Organization         Address  Phone  Notes  [facility]  [phone]   Domenic Schwab, PhD [address] Arlis [place], [place]   [phone] or [phone]   [place], [place] [phone]   [facility] [address] [place], [place] [phone] Insurance/Medicaid/sponsorship through [facility] and Families [address]                                    [place], [place] [phone] [place] [address], [place] [phone]    Dr. Adele Schilder  [phone]   [facility] [place] Dept. 1) [address], [place] 2) [place] 3)  [place] [address], [place] [phone]  [phone]   [place] [phone] or [phone] (After Hours)

## 2013-09-30 NOTE — ED Notes (Signed)
Pt alert, nad, arrives from home, c/o low back pain, onset several days ago, denies trauma or injury, ambulates to triage, steady gait noted 

## 2013-09-30 NOTE — ED Notes (Signed)
clarification of work note. can return tomorrow with restrictions as written per Trixie DredgeEmily West PA

## 2013-09-30 NOTE — ED Provider Notes (Signed)
CSN: [id number]     Arrival date & time 09/30/13  0915 History   First MD Initiated Contact with Patient 09/30/13 [phone]     Chief Complaint  Patient presents with  . Back Pain     (Consider location/radiation/quality/duration/timing/severity/associated sxs/prior Treatment) HPI Reports he has had low back pain for the past several days. States that he woke up with the pain he had slept wrong but has had persistent pain since then. Pain is worse with bending, twisting, movement, and heavy lifting. Patient denies any known injury but does work at [employer] doing heavy lifting.  Denies fevers, chills, abdominal pain, loss of control of bowel or bladder, bowel or urinary complaints.  Denies weakness or numbness of the lower extremities, saddle anesthesia. No history of cancer, denies IV drug use.   Past Medical History  Diagnosis Date  . Asthma    History reviewed. No pertinent past surgical history. Family History  Problem Relation Age of Onset  . Diabetes Mother    History  Substance Use Topics  . Smoking status: Never Smoker   . Smokeless tobacco: Not on file  . Alcohol Use: No    Review of Systems  Constitutional: Negative for fever.  Gastrointestinal: Negative for nausea, vomiting, abdominal pain and diarrhea.  Genitourinary: Negative for dysuria, urgency and frequency.  Musculoskeletal: Positive for back pain.  Neurological: Negative for weakness and numbness.  All other systems reviewed and are negative.      Allergies  Review of patient's allergies indicates no known allergies.  Home Medications   Current Outpatient Rx  Name  Route  Sig  Dispense  Refill  . cyclobenzaprine (FLEXERIL) 10 MG tablet   Oral   Take 1 tablet (10 mg total) by mouth 2 (two) times daily as needed for muscle spasms.   15 tablet   0   . ibuprofen (ADVIL,MOTRIN) 800 MG tablet   Oral   Take 1 tablet (800 mg total) by mouth every 8 (eight) hours as needed for mild pain or moderate pain.  15 tablet   0    BP 123/72  Pulse 75  Temp(Src) 98.7 F (37.1 C) (Oral)  Resp 20  SpO2 98% Physical Exam  Nursing note and vitals reviewed. Constitutional: He appears well-developed and well-nourished. No distress.  HENT:  Head: Normocephalic and atraumatic.  Neck: Neck supple.  Pulmonary/Chest: Effort normal.  Abdominal: Soft. He exhibits no distension. There is no tenderness.  Musculoskeletal: Normal range of motion. He exhibits no edema.       Arms: Spine nontender, no crepitus, or stepoffs.  Lower extremities:  Strength 5/5, sensation intact, distal pulses intact.     Neurological: He is alert.  Skin: He is not diaphoretic.    ED Course  Procedures (including critical care time) Labs Review Labs Reviewed - No data to display Imaging Review No results found.   EKG Interpretation None      MDM   Final diagnoses:  Low back pain   Afebrile, nontoxic patient with mechanical low back pain. Works doing heavy lifting but no known trauma or specific incident causing pain.  No red flags.  D/C home with flexeril and ibuprofen.  Discussed findings, treatment, and follow up  with patient.  Pt given return precautions.  Pt verbalizes understanding and agrees with plan.      I doubt any other EMC precluding discharge at this time including, but not necessarily limited to the following: cauda equina, acute cord compression or significant nerve root compression,  ruptured disk, AAA, fracture, tumor, or infection.     Trixie Dredgemily Rozalia Dino, PA-C 09/30/13 1013

## 2013-10-03 ENCOUNTER — Emergency Department (INDEPENDENT_AMBULATORY_CARE_PROVIDER_SITE_OTHER)
Admission: EM | Admit: 2013-10-03 | Discharge: 2013-10-03 | Disposition: A | Discharge status: Home / Self Care | Payer: 59 | Source: Home / Self Care | Attending: Emergency Medicine | Admitting: Emergency Medicine

## 2013-10-03 ENCOUNTER — Emergency Department (INDEPENDENT_AMBULATORY_CARE_PROVIDER_SITE_OTHER): Payer: 59

## 2013-10-03 ENCOUNTER — Encounter (HOSPITAL_COMMUNITY): Payer: Self-pay | Admitting: Emergency Medicine

## 2013-10-03 DIAGNOSIS — M545 Low back pain, unspecified: Secondary | ICD-10-CM

## 2013-10-03 LAB — POCT URINALYSIS DIP (DEVICE)
BILIRUBIN URINE: NEGATIVE
Glucose, UA: NEGATIVE mg/dL
HGB URINE DIPSTICK: NEGATIVE
KETONES UR: NEGATIVE mg/dL
NITRITE: NEGATIVE
Protein, ur: NEGATIVE mg/dL
UROBILINOGEN UA: 0.2 mg/dL (ref 0.0–1.0)
pH: 5.5 (ref 5.0–8.0)

## 2013-10-03 MED ORDER — HYDROCODONE-ACETAMINOPHEN 5-325 MG PO TABS: ORAL_TABLET | ORAL | Status: DC

## 2013-10-03 MED ORDER — DICLOFENAC SODIUM 75 MG PO TBEC: 75.0000 mg | DELAYED_RELEASE_TABLET | Freq: Two times a day (BID) | ORAL | Status: DC

## 2013-10-03 MED ORDER — TIZANIDINE HCL 4 MG PO TABS: 4.0000 mg | ORAL_TABLET | Freq: Four times a day (QID) | ORAL | Status: DC | PRN

## 2013-10-03 NOTE — ED Notes (Signed)
Patient complains of middle back pain that started 5 days ago; states he woke up one morning and pain started; states cramping and spasms that radiate down to middle back.

## 2013-10-03 NOTE — ED Provider Notes (Signed)
Chief Complaint   Chief Complaint  Patient presents with  . Back Pain    History of Present Illness   Benjamin Hudson is a 23 year old male who woke up 5 days ago with pain in his mid to lower back without radiation. He denies any injury to the back. The pain is rated a 10 over 10 at the worst and now is a 5/10. The pain does not radiate down into the legs and there is no numbness, tingling, weakness in the legs. No difficulty walking. No dysuria, frequency, urgency, or hematuria. He denies any incontinence of bladder or bowel. There is no abdominal pain, no fever, chills, headache, stiff neck, or weight loss.  Review of Systems   Other than as noted above, the patient denies any of the following symptoms: Systemic:  No fever, chills, or unexplained weight loss. GI:  No abdominal painor incontinence of bowel. GU:  No dysuria, frequency, urgency, or hematuria. No incontinence of urine or urinary retention.  M-S:  No neck pain or arthritis. Neuro:  No paresthesias, headache, saddle anesthesia, muscular weakness, or progressive neurological deficit.  PMFSH   Past medical history, family history, social history, meds, and allergies were reviewed. Specifically, there is no history of cancer, major trauma, osteoporosis, immunosuppression, or HIV infection.   Physical Examination    Vital signs:  BP 128/73  Pulse 72  Temp(Src) 98.2 F (36.8 C) (Oral)  Resp 18  SpO2 100% General:  Alert, oriented, in no distress. Abdomen:  Soft, non-tender.  No organomegaly or mass.  No pulsatile midline abdominal mass or bruit. Back:  There is moderate tenderness to palpation over the mid to lower back or the paravertebral muscles but not over the midline. There's no pain to palpation of the sacroiliac joints. His back has a full range of motion with pain on movement. Straight leg raising is negative. Neuro:  Normal muscle strength, sensations and DTRs. Extremities: Pedal pulses were full, there was  no edema. Skin:  Clear, warm and dry.  No rash.  Labs   Results for orders placed during the hospital encounter of 10/03/13  POCT URINALYSIS DIP (DEVICE)      Result Value Ref Range   Glucose, UA NEGATIVE  NEGATIVE mg/dL   Bilirubin Urine NEGATIVE  NEGATIVE   Ketones, ur NEGATIVE  NEGATIVE mg/dL   Specific Gravity, Urine >=1.030  1.005 - 1.030   Hgb urine dipstick NEGATIVE  NEGATIVE   pH 5.5  5.0 - 8.0   Protein, ur NEGATIVE  NEGATIVE mg/dL   Urobilinogen, UA 0.2  0.0 - 1.0 mg/dL   Nitrite NEGATIVE  NEGATIVE   Leukocytes, UA SMALL (*) NEGATIVE     Radiology   Dg Lumbar Spine Complete  10/03/2013   CLINICAL DATA:  Low back pain  EXAM: LUMBAR SPINE - COMPLETE 4+ VIEW  COMPARISON:  None.  FINDINGS: Five lumbar type vertebral bodies are well visualized. Vertebral body height is well maintained. No spondylolysis or spondylolisthesis is noted. A mild scoliosis concave to the right is noted which may be positional in nature.  IMPRESSION: No acute abnormality noted.   Electronically Signed   By: Alcide Clever M.D.   On: 10/03/2013 13:53   Assessment   The encounter diagnosis was Lumbago.  No evidence of radiculopathy or cauda equina syndrome.  Plan     1.  Meds:  The following meds were prescribed:   Discharge Medication List as of 10/03/2013  2:29 PM    START taking these  medications   Details  diclofenac (VOLTAREN) 75 MG EC tablet Take 1 tablet (75 mg total) by mouth 2 (two) times daily., Starting 10/03/2013, Until Discontinued, Normal    HYDROcodone-acetaminophen (NORCO/VICODIN) 5-325 MG per tablet 1 to 2 tabs every 4 to 6 hours as needed for pain., Print    tiZANidine (ZANAFLEX) 4 MG tablet Take 1 tablet (4 mg total) by mouth every 6 (six) hours as needed for muscle spasms., Starting 10/03/2013, Until Discontinued, Normal        2.  Patient Education/Counseling:  The patient was given appropriate handouts, self care instructions, and instructed in symptomatic relief. The  patient was encouraged to try to be as active as possible and given some exercises to do followed by moist heat.  3.  Follow up:  The patient was told to follow up here if no better in 3 to 4 days, or sooner if becoming worse in any way, and given some red flag symptoms such as worsening pain or new neurological symptoms which would prompt immediate return.  Follow up with Dr. Glee ArvinMichael Xu if no better in 2 weeks.     Reuben Likesavid C Prescious Hurless, MD 10/03/13 [phone]

## 2013-10-03 NOTE — Discharge Instructions (Signed)
Do exercises twice daily followed by moist heat for 15 minutes. ° ° ° ° ° °Try to be as active as possible. ° °If no better in 2 weeks, follow up with orthopedist. ° ° °

## 2013-10-05 LAB — URINE CULTURE
COLONY COUNT: NO GROWTH
Culture: NO GROWTH
Special Requests: NORMAL

## 2013-10-05 NOTE — Progress Notes (Signed)
Quick Note:  Test result was normal. No further action is needed at this time. ______ 

## 2013-10-06 NOTE — ED Notes (Signed)
Pt. called on VM and wanted his lab result.  I called pt. back.  Pt. verified x 2 and given result- urine culture: no growth.  He asked about a form he needs Dr. Lorenz CoasterKeller to fill out for him to return to work.  He said he talked to him about it while he was here and the doctor said he would fill it out.   I told him Dr. Lorenz CoasterKeller was not here today.  He said he would call back tomorrow. Vassie MoselleYork, [place] Schorr M 10/06/2013

## 2013-10-11 NOTE — ED Notes (Signed)
I called pt. And his girlfriend answered and took the message. I told her Dr. Lorenz CoasterKeller has completed his form and I would leave it at the front desk to be picked up. Benjamin Hudson, Benjamin Hudson 10/11/2013

## 2013-12-12 ENCOUNTER — Encounter (HOSPITAL_COMMUNITY): Payer: Self-pay | Admitting: Emergency Medicine

## 2013-12-12 ENCOUNTER — Emergency Department (HOSPITAL_COMMUNITY)
Admission: EM | Admit: 2013-12-12 | Discharge: 2013-12-12 | Disposition: A | Discharge status: Home / Self Care | Payer: 59 | Attending: Emergency Medicine | Admitting: Emergency Medicine

## 2013-12-12 DIAGNOSIS — Z791 Long term (current) use of non-steroidal anti-inflammatories (NSAID): Secondary | ICD-10-CM | POA: Insufficient documentation

## 2013-12-12 DIAGNOSIS — H53149 Visual discomfort, unspecified: Secondary | ICD-10-CM | POA: Insufficient documentation

## 2013-12-12 DIAGNOSIS — J329 Chronic sinusitis, unspecified: Secondary | ICD-10-CM | POA: Insufficient documentation

## 2013-12-12 DIAGNOSIS — J45909 Unspecified asthma, uncomplicated: Secondary | ICD-10-CM | POA: Insufficient documentation

## 2013-12-12 HISTORY — DX: Other seasonal allergic rhinitis: J30.2

## 2013-12-12 MED ORDER — TRIAMCINOLONE ACETONIDE 55 MCG/ACT NA AERO: 2.0000 | INHALATION_SPRAY | Freq: Every day | NASAL | Status: DC

## 2013-12-12 MED ORDER — AMOXICILLIN 500 MG PO CAPS: 500.0000 mg | ORAL_CAPSULE | Freq: Three times a day (TID) | ORAL | Status: DC

## 2013-12-12 NOTE — ED Provider Notes (Signed)
Medical screening examination/treatment/procedure(s) were performed by non-physician practitioner and as supervising physician I was immediately available for consultation/collaboration.   EKG Interpretation None       Ethelda Chick, MD 12/12/13 1409

## 2013-12-12 NOTE — ED Provider Notes (Signed)
CSN: [id number]     Arrival date & time 12/12/13  1256 History  This chart was scribed for non-physician practitioner, Teressa LowerVrinda Lylah Lantis, FNP,working with Ethelda ChickMartha K Linker, MD, by Karle PlumberJennifer Tensley, ED Scribe.  This patient was seen in room WTR6/WTR6 and the patient's care was started at 1:17 PM.  Chief Complaint  Patient presents with  . Headache  . Sore Throat   The history is provided by the patient. No language interpreter was used.   HPI Comments:  Benjamin Hudson is a 23 y.o. male who presents to the Emergency Department complaining of cough, HA and sore throat that started approximately four days ago. He reports associated sinus pressure and nasal congestion. He reports subjective fever "a couple days ago". He states he has been taking NyQuil and cough drops for his symptoms with only mild relief. He denies nausea, vomiting, or diarrhea. He denies smoking.   Past Medical History  Diagnosis Date  . Asthma    No past surgical history on file. Family History  Problem Relation Age of Onset  . Diabetes Mother    History  Substance Use Topics  . Smoking status: Never Smoker   . Smokeless tobacco: Not on file  . Alcohol Use: No    Review of Systems  Constitutional: Negative for fever.  HENT: Positive for congestion, sinus pressure and sore throat.   Eyes: Positive for photophobia.  Gastrointestinal: Negative for nausea, vomiting and diarrhea.  Neurological: Positive for headaches.  All other systems reviewed and are negative.   Allergies  Review of patient's allergies indicates no known allergies.  Home Medications   Prior to Admission medications   Medication Sig Start Date End Date Taking? Authorizing Provider  cyclobenzaprine (FLEXERIL) 10 MG tablet Take 1 tablet (10 mg total) by mouth 2 (two) times daily as needed for muscle spasms. 09/30/13   Trixie DredgeEmily West, PA-C  diclofenac (VOLTAREN) 75 MG EC tablet Take 1 tablet (75 mg total) by mouth 2 (two) times daily. 10/03/13    Reuben Likesavid C Keller, MD  HYDROcodone-acetaminophen (NORCO/VICODIN) 5-325 MG per tablet 1 to 2 tabs every 4 to 6 hours as needed for pain. 10/03/13   Reuben Likesavid C Keller, MD  ibuprofen (ADVIL,MOTRIN) 800 MG tablet Take 1 tablet (800 mg total) by mouth every 8 (eight) hours as needed for mild pain or moderate pain. 09/30/13   Trixie DredgeEmily West, PA-C  tiZANidine (ZANAFLEX) 4 MG tablet Take 1 tablet (4 mg total) by mouth every 6 (six) hours as needed for muscle spasms. 10/03/13   Reuben Likesavid C Keller, MD   Triage Vitals: BP 133/57  Pulse 82  Temp(Src) 98.8 F (37.1 C) (Oral)  Resp 18  SpO2 99% Physical Exam  Nursing note and vitals reviewed. Constitutional: He is oriented to person, place, and time. He appears well-developed and well-nourished.  HENT:  Head: Normocephalic and atraumatic.  Right Ear: Hearing, tympanic membrane, external ear and ear canal normal.  Left Ear: Hearing, tympanic membrane, external ear and ear canal normal.  Nose: Mucosal edema present. Right sinus exhibits frontal sinus tenderness.  Mouth/Throat: Uvula is midline and mucous membranes are normal. Posterior oropharyngeal erythema present. No oropharyngeal exudate, posterior oropharyngeal edema or tonsillar abscesses.  Eyes: EOM are normal.  Neck: Normal range of motion.  Cardiovascular: Normal rate.   Pulmonary/Chest: Effort normal.  Musculoskeletal: Normal range of motion.  Neurological: He is alert and oriented to person, place, and time.  Skin: Skin is warm and dry.  Psychiatric: He has a normal mood and  affect. His behavior is normal.    ED Course  Procedures (including critical care time) DIAGNOSTIC STUDIES: Oxygen Saturation is 99% on RA, normal by my interpretation.   COORDINATION OF CARE: 1:20 PM- Will prescribe antibiotics and a nasal spray. Pt verbalizes understanding and agrees to plan.  Medications - No data to display  Labs Review Labs Reviewed - No data to display  Imaging Review No results found.   EKG  Interpretation None      MDM   Final diagnoses:  Sinusitis    No meningeal symptoms. Will treat for sinusitis  I personally performed the services described in this documentation, which was scribed in my presence. The recorded information has been reviewed and is accurate.    Teressa Lower, NP 12/12/13 1351

## 2013-12-12 NOTE — Discharge Instructions (Signed)

## 2013-12-12 NOTE — ED Notes (Signed)
Pt presents with c/o headache and a sore throat. Pt says that his symptoms started Tuesday of this week. Pt says he has some nasal congestion as well and feels some pressure under his eyes. Reports light sensitivity.

## 2014-07-12 ENCOUNTER — Emergency Department (HOSPITAL_COMMUNITY)
Admission: EM | Admit: 2014-07-12 | Discharge: 2014-07-12 | Disposition: A | Discharge status: Home / Self Care | Payer: 59 | Attending: Emergency Medicine | Admitting: Emergency Medicine

## 2014-07-12 ENCOUNTER — Encounter (HOSPITAL_COMMUNITY): Payer: Self-pay | Admitting: *Deleted

## 2014-07-12 DIAGNOSIS — Z792 Long term (current) use of antibiotics: Secondary | ICD-10-CM | POA: Insufficient documentation

## 2014-07-12 DIAGNOSIS — Z202 Contact with and (suspected) exposure to infections with a predominantly sexual mode of transmission: Secondary | ICD-10-CM | POA: Diagnosis present

## 2014-07-12 DIAGNOSIS — Z7951 Long term (current) use of inhaled steroids: Secondary | ICD-10-CM | POA: Diagnosis not present

## 2014-07-12 DIAGNOSIS — J45909 Unspecified asthma, uncomplicated: Secondary | ICD-10-CM | POA: Insufficient documentation

## 2014-07-12 DIAGNOSIS — B356 Tinea cruris: Secondary | ICD-10-CM | POA: Diagnosis not present

## 2014-07-12 MED ORDER — CLOTRIMAZOLE 1 % EX CREA: TOPICAL_CREAM | CUTANEOUS | Status: DC

## 2014-07-12 NOTE — ED Provider Notes (Signed)
CSN: [id number]     Arrival date & time 07/12/14  1905 History  This chart was scribed for non-physician practitioner, Elpidio AnisShari Zakiyyah Savannah, Benjamin Hudson working with Benjamin BarretteMarcy Pfeiffer, MD by Greggory StallionKayla Andersen, ED scribe. This patient was seen in room WTR6/WTR6 and the patient's care was started at 8:27 PM.   Chief Complaint  Patient presents with  . SEXUALLY TRANSMITTED DISEASE   The history is provided by the patient. No language interpreter was used.    HPI Comments: Benjamin Hudson is a 23 y.o. male who presents to the Emergency Department complaining of an itchy rash to his groin area that started 2 days ago. Denies pain around the bumps. He states his skin is starting to peel around the area. Pt's girlfriend is currently getting evaluated for vaginal discahrge and possible yeast infection. Denies dysuria, penile discharge.   Past Medical History  Diagnosis Date  . Asthma   . Seasonal allergies    History reviewed. No pertinent past surgical history. Family History  Problem Relation Age of Onset  . Diabetes Mother    History  Substance Use Topics  . Smoking status: Never Smoker   . Smokeless tobacco: Not on file  . Alcohol Use: No    Review of Systems  Genitourinary: Negative for dysuria and discharge.  Skin: Positive for rash.  All other systems reviewed and are negative.  Allergies  Review of patient's allergies indicates no known allergies.  Home Medications   Prior to Admission medications   Medication Sig Start Date End Date Taking? Authorizing Provider  amoxicillin (AMOXIL) 500 MG capsule Take 1 capsule (500 mg total) by mouth 3 (three) times daily. 12/12/13   Teressa LowerVrinda Pickering, NP  triamcinolone (NASACORT AQ) 55 MCG/ACT AERO nasal inhaler Place 2 sprays into the nose daily. 12/12/13   Teressa LowerVrinda Pickering, NP   BP 130/68 mmHg  Pulse 78  Temp(Src) 98.2 F (36.8 C) (Oral)  Resp 16  SpO2 99%   Physical Exam  Constitutional: He is oriented to person, place, and time. He appears  well-developed and well-nourished. No distress.  HENT:  Head: Normocephalic and atraumatic.  Eyes: Conjunctivae and EOM are normal.  Neck: Neck supple. No tracheal deviation present.  Cardiovascular: Normal rate.   Pulmonary/Chest: Effort normal. No respiratory distress.  Genitourinary:  Scrotal redness that is scaling without tenderness or ulceration and has fungal appearance. Minimal suprapubic rash without pustules. No redness. No tenderness. Circumcised penis. No discharge.   Musculoskeletal: Normal range of motion.  Neurological: He is alert and oriented to person, place, and time.  Skin: Skin is warm and dry.  Psychiatric: He has a normal mood and affect. His behavior is normal.  Nursing note and vitals reviewed.   ED Course  Procedures (including critical care time)  DIAGNOSTIC STUDIES: Oxygen Saturation is 99% on RA, normal by my interpretation.    COORDINATION OF CARE: 8:29 PM-Discussed treatment plan which includes GC/chlamydia testing and topical antifungal cream with pt at bedside and pt agreed to plan.   Labs Review Labs Reviewed - No data to display  Imaging Review No results found.   EKG Interpretation None      MDM   Final diagnoses:  None    1. Tinea cruris  Girlfriend with reported yeast infection, c/w with his exam findings. GC/Chlam pending - no treatment in ED tonight.  I personally performed the services described in this documentation, which was scribed in my presence. The recorded information has been reviewed and is accurate.  Benjamin CornfieldShari A  Junius FinnerUpstill, Benjamin Hudson 07/12/14 2039  Benjamin BarretteMarcy Pfeiffer, MD 07/12/14 2329

## 2014-07-12 NOTE — ED Notes (Signed)
Pt is here with his gf, believes his gf gave him a yeast infection. He has penile itching, peeling skin around scrotum and rash. Denies pain.

## 2014-07-12 NOTE — Discharge Instructions (Signed)
Jock Itch Jock itch is a fungal infection of the skin in the groin area. It is sometimes called "ringworm" even though it is not caused by a worm. A fungus is a type of germ that thrives in dark, damp places.  CAUSES  This infection may spread from:  A fungus infection elsewhere on the body (such as athlete's foot).  Sharing towels or clothing. This infection is more common in:  Hot, humid climates.  People who wear tight-fitting clothing or wet bathing suits for long periods of time.  Athletes.  Overweight people.  People with diabetes. SYMPTOMS  Jock itch causes the following symptoms:  Red, pink or brown rash in the groin. Rash may spread to the thighs, anus, and buttocks.  Itching. DIAGNOSIS  Your caregiver may make the diagnosis by looking at the rash. Sometimes a skin scraping will be sent to test for fungus. Testing can be done either by looking under the microscope or by doing a culture (test to try to grow the fungus). A culture can take up to 2 weeks to come back. TREATMENT  Jock itch may be treated with:  Skin cream or ointment to kill fungus.  Medicine by mouth to kill fungus.  Skin cream or ointment to calm the itching.  Compresses or medicated powders to dry the infected skin. HOME CARE INSTRUCTIONS   Be sure to treat the rash completely. Follow your caregiver's instructions. It can take a couple of weeks to treat. If you do not treat the infection long enough, the rash can come back.  Wear loose-fitting clothing.  Men should wear cotton boxer shorts.  Women should wear cotton underwear.  Avoid hot baths.  Dry the groin area well after bathing. SEEK MEDICAL CARE IF:   Your rash is worse.  Your rash is spreading.  Your rash returns after treatment is finished.  Your rash is not gone in 4 weeks. Fungal infections are slow to respond to treatment. Some redness may remain for several weeks after the fungus is gone. SEEK IMMEDIATE MEDICAL CARE  IF:  The area becomes red, warm, tender, and swollen.  You have a fever. Document Released: 06/29/2002 Document Revised: 10/01/2011 Document Reviewed: 05/28/2008 Exit[employer] Patient Information 2015 [employer]. This information is not intended to replace advice given to you by your health care provider. Make sure you discuss any questions you have with your health care provider.  

## 2014-07-27 LAB — GC/CHLAMYDIA PROBE AMP
CT Probe RNA: NEGATIVE
GC Probe RNA: NEGATIVE

## 2014-07-29 IMAGING — CR DG LUMBAR SPINE COMPLETE 4+V
5 series · 5 of 5 positions shown · non-contrast
Comparison: None.

CLINICAL DATA: Low back pain

EXAM:
LUMBAR SPINE - COMPLETE 4+ VIEW

[view not recorded (1 of 5)]
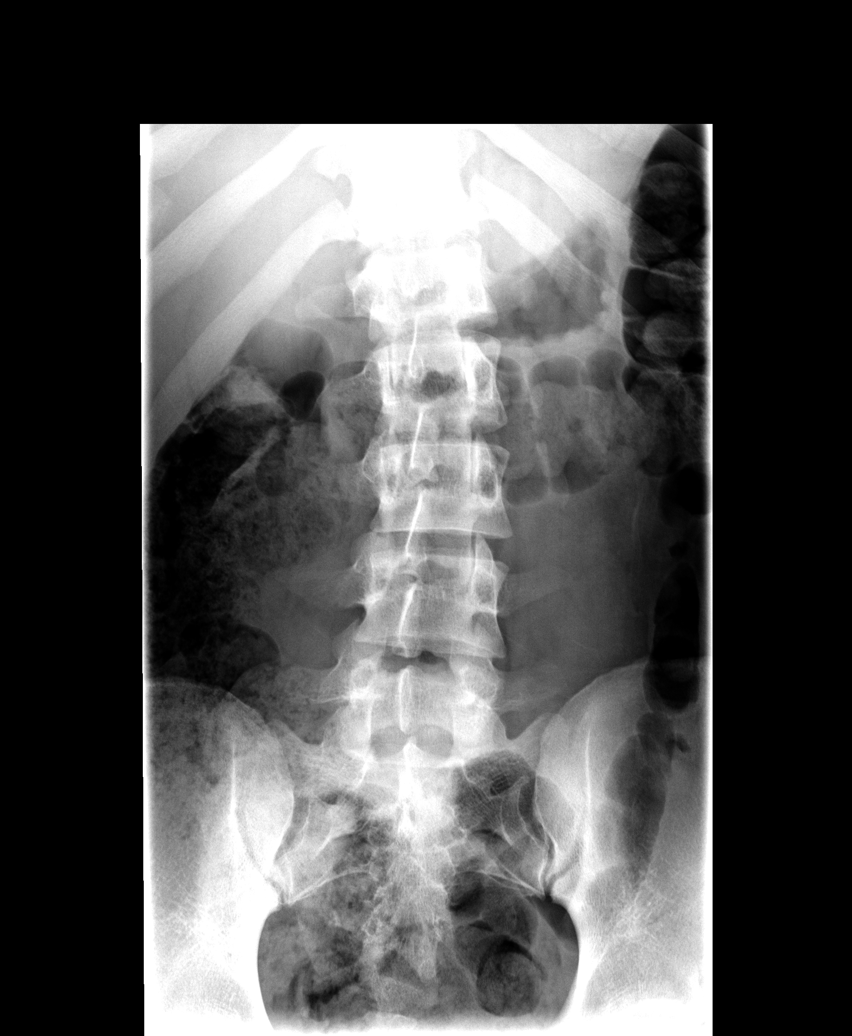

[view not recorded (2 of 5)]
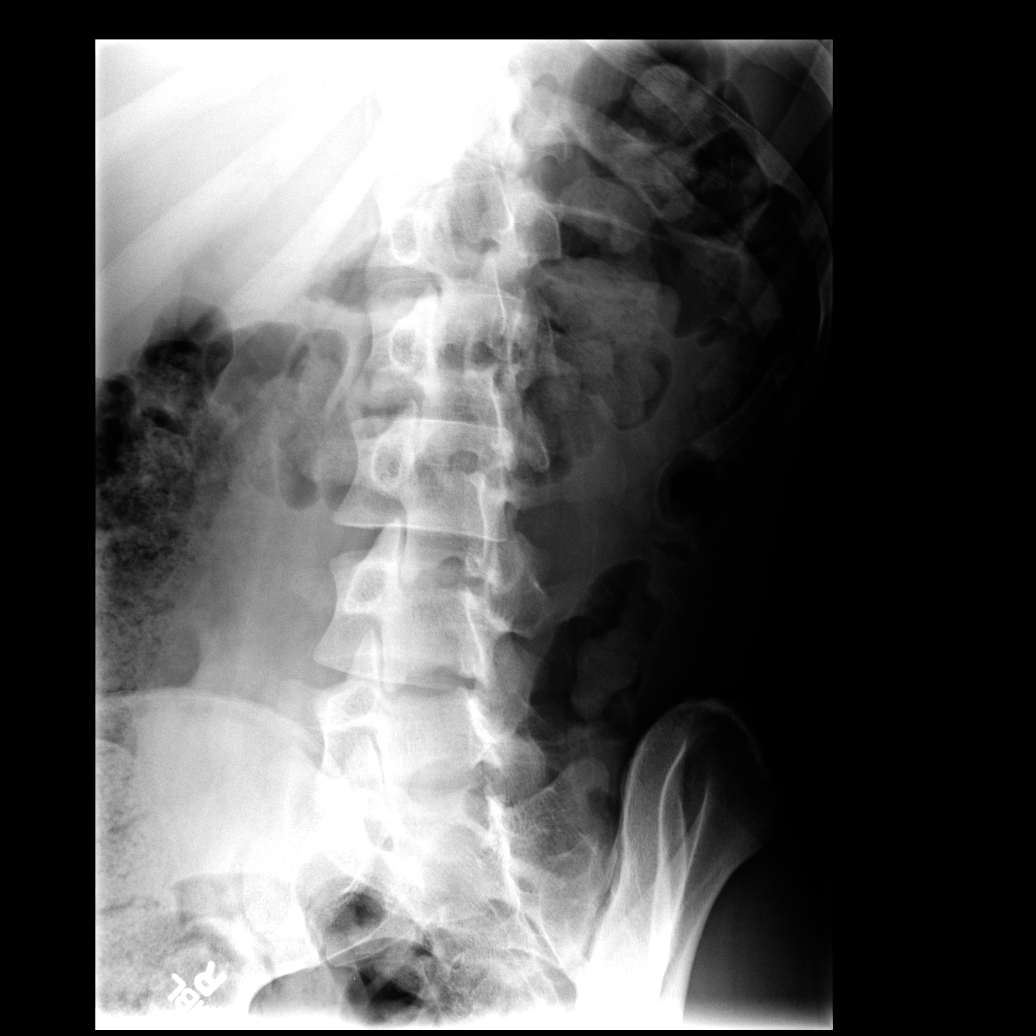

[view not recorded (3 of 5)]
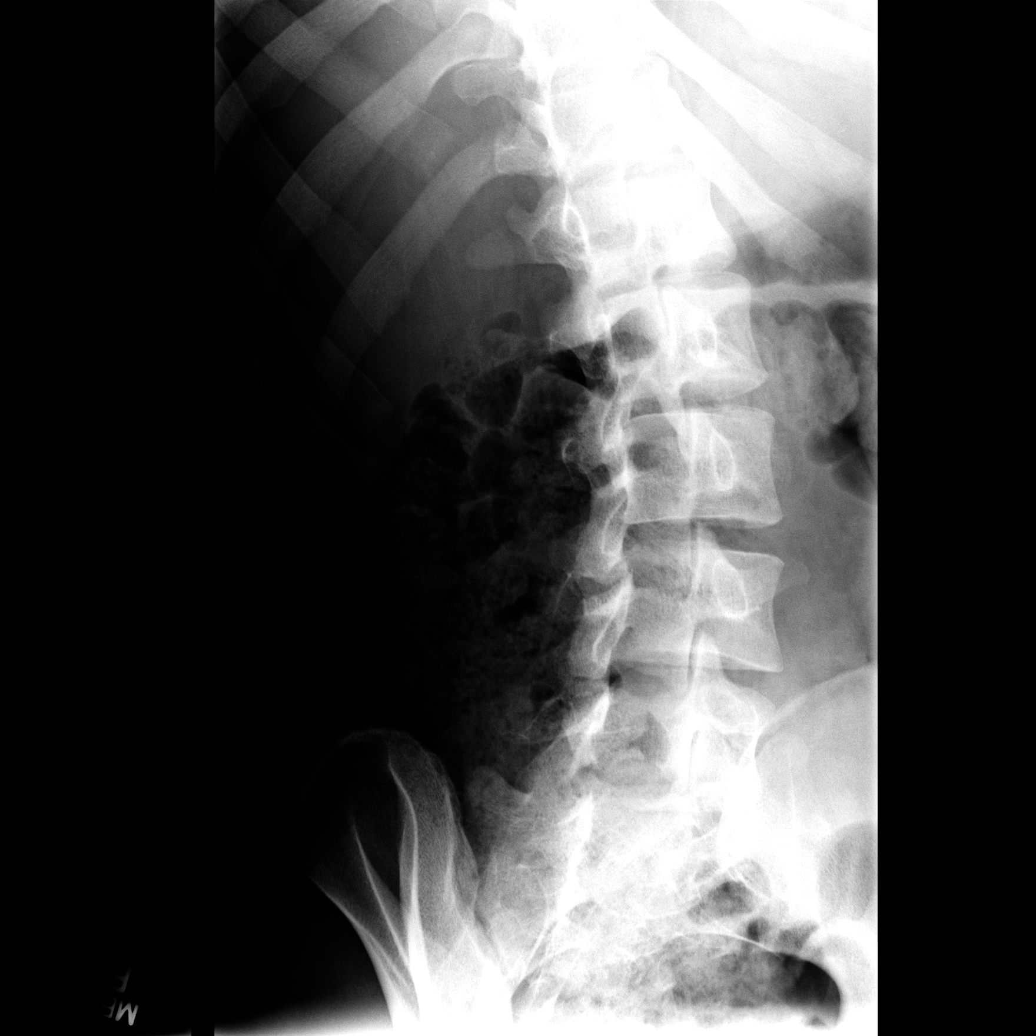

[view not recorded (4 of 5)]
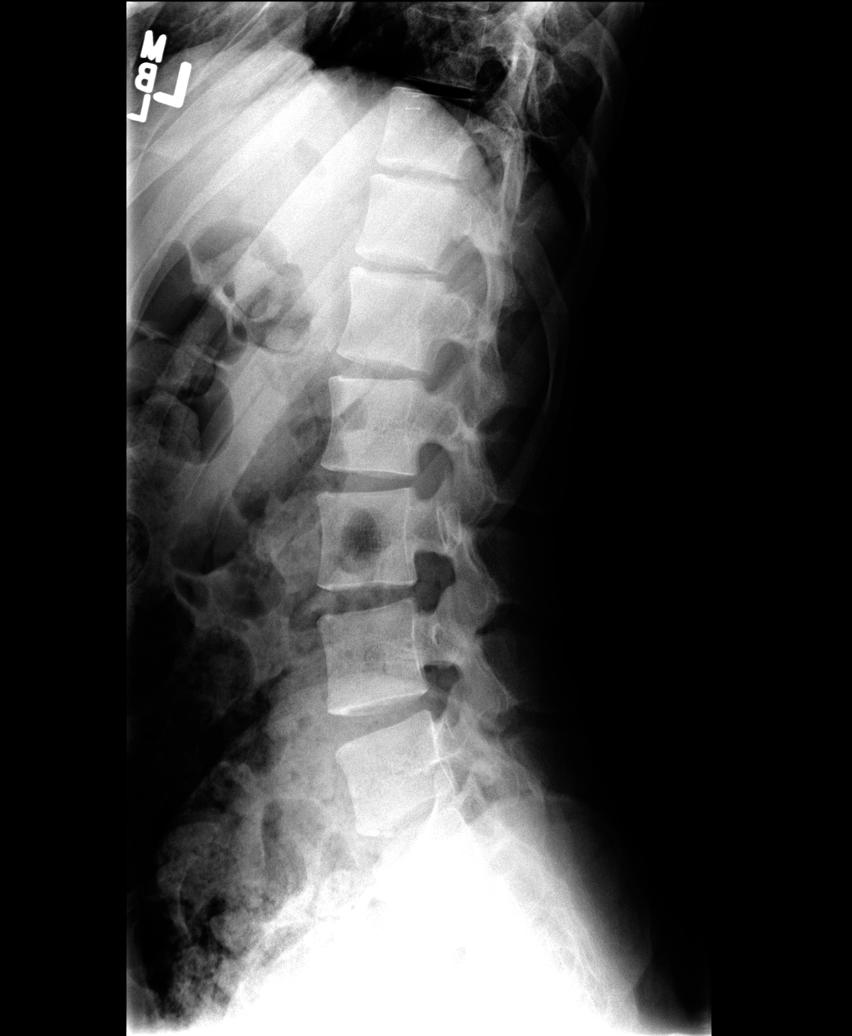

[view not recorded (5 of 5)]
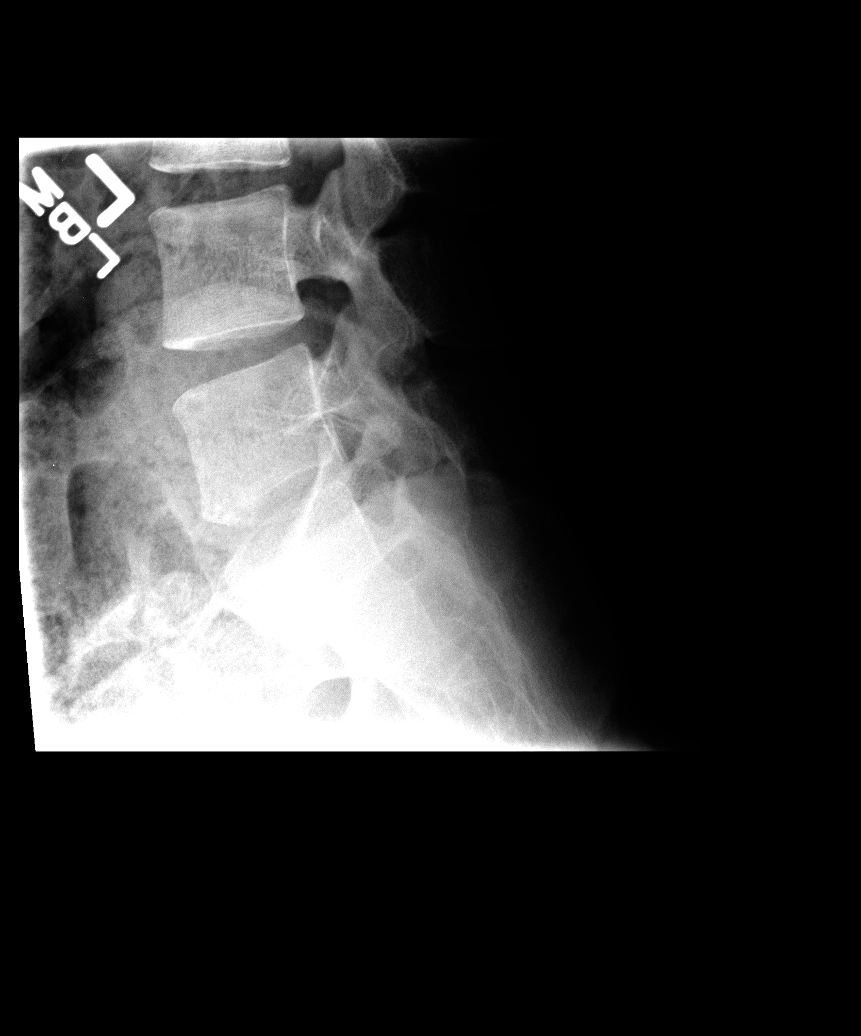

[5 of 5 positions shown; findings below may reference images not displayed]

FINDINGS: Five lumbar type vertebral bodies are well visualized. Vertebral
body height is well maintained. No spondylolysis or
spondylolisthesis is noted. A mild scoliosis concave to the right is
noted which may be positional in nature.
IMPRESSION: No acute abnormality noted.

## 2018-05-27 ENCOUNTER — Encounter (HOSPITAL_COMMUNITY): Payer: Self-pay | Admitting: Emergency Medicine

## 2018-05-27 ENCOUNTER — Emergency Department (HOSPITAL_COMMUNITY)
Admission: EM | Admit: 2018-05-27 | Discharge: 2018-05-27 | Disposition: A | Discharge status: Home / Self Care | Payer: 59 | Attending: Emergency Medicine | Admitting: Emergency Medicine

## 2018-05-27 ENCOUNTER — Other Ambulatory Visit: Payer: Self-pay

## 2018-05-27 DIAGNOSIS — J45909 Unspecified asthma, uncomplicated: Secondary | ICD-10-CM | POA: Insufficient documentation

## 2018-05-27 DIAGNOSIS — M545 Low back pain, unspecified: Secondary | ICD-10-CM

## 2018-05-27 MED ORDER — CYCLOBENZAPRINE HCL 10 MG PO TABS
10.0000 mg | ORAL_TABLET | Freq: Once | ORAL | Status: AC
  Administered 2018-05-27: 10 mg via ORAL
  Filled 2018-05-27: qty 1

## 2018-05-27 MED ORDER — HYDROCODONE-ACETAMINOPHEN 5-325 MG PO TABS: 1.0000 | ORAL_TABLET | ORAL | 0 refills | Status: DC | PRN

## 2018-05-27 MED ORDER — IBUPROFEN 800 MG PO TABS
800.0000 mg | ORAL_TABLET | Freq: Once | ORAL | Status: AC
  Administered 2018-05-27: 800 mg via ORAL
  Filled 2018-05-27: qty 1

## 2018-05-27 MED ORDER — NAPROXEN 500 MG PO TABS: 500.0000 mg | ORAL_TABLET | Freq: Two times a day (BID) | ORAL | 0 refills | Status: AC

## 2018-05-27 MED ORDER — HYDROCODONE-ACETAMINOPHEN 5-325 MG PO TABS
1.0000 | ORAL_TABLET | Freq: Once | ORAL | Status: AC
  Administered 2018-05-27: 1 via ORAL
  Filled 2018-05-27: qty 1

## 2018-05-27 MED ORDER — CYCLOBENZAPRINE HCL 10 MG PO TABS: 10.0000 mg | ORAL_TABLET | Freq: Three times a day (TID) | ORAL | 0 refills | Status: DC | PRN

## 2018-05-27 MED ORDER — HYDROCODONE-ACETAMINOPHEN 5-325 MG PO TABS: 1.0000 | ORAL_TABLET | ORAL | 0 refills | Status: AC | PRN

## 2018-05-27 NOTE — ED Triage Notes (Signed)
Pt from home with c/o lower back pain. Pt denies injury or stress to back. Pt denies pain in legs or urinary symptoms. Pt states pain is constant and a 7/10. Pt reports taking ibuprofen with no relief.

## 2018-05-27 NOTE — Discharge Instructions (Addendum)
Apply ice several times a day. °

## 2018-05-27 NOTE — ED Notes (Signed)
Patient is having lower back pain. Patient has not had any injury to back or pass medical hx of back problems.

## 2018-05-27 NOTE — ED Provider Notes (Signed)
[facility]-EMERGENCY DEPT Provider Note   CSN: [id number] Arrival date & time: 05/27/18  1932     History   Chief Complaint Chief Complaint  Patient presents with  . Back Pain    HPI Benjamin Hudson is a 27 y.o. male.  The history is provided by the patient.  He has a history of asthma and comes in complaining of back pain.  He states he had some back pain last night, but he felt okay this morning.  This afternoon, he bent over to pick up a trash can and he had sudden recurrence of low back pain.  Pain was rated at 7/10.  It did not have any radiation.  He took ibuprofen without relief.  He states he has had frequent problems with back pain in the past and wonders if the problem is the bed that he is lying on.  He is to have a job which involves a lot of lifting, but his current job does not involve lifting.  He does have to do a fair amount of bending.  He denies any unusual activity recently.  He denies weakness, numbness, tingling.  He denies any bowel or bladder dysfunction.  Past Medical History:  Diagnosis Date  . Asthma   . Seasonal allergies     There are no active problems to display for this patient.   History reviewed. No pertinent surgical history.      Home Medications    Prior to Admission medications   Medication Sig Start Date End Date Taking? Authorizing Provider  amoxicillin (AMOXIL) 500 MG capsule Take 1 capsule (500 mg total) by mouth 3 (three) times daily. 12/12/13   Teressa Lower, NP  clotrimazole (LOTRIMIN) 1 % cream Apply to affected area 2 times daily 07/12/14   Elpidio Anis, PA-C  triamcinolone (NASACORT AQ) 55 MCG/ACT AERO nasal inhaler Place 2 sprays into the nose daily. 12/12/13   Teressa Lower, NP    Family History Family History  Problem Relation Age of Onset  . Diabetes Mother     Social History Social History   Tobacco Use  . Smoking status: Never Smoker  . Smokeless tobacco: Never Used  Substance  Use Topics  . Alcohol use: No  . Drug use: No     Allergies   Patient has no known allergies.   Review of Systems Review of Systems  All other systems reviewed and are negative.    Physical Exam Updated Vital Signs BP 137/82 (BP Location: Left Arm)   Pulse 75   Temp 98.5 F (36.9 C) (Oral)   Resp 14   Ht 6' (1.829 m)   Wt 88.5 kg   SpO2 100%   BMI 26.45 kg/m   Physical Exam  Nursing note and vitals reviewed.  27 year old male, resting comfortably and in no acute distress. Vital signs are normal. Oxygen saturation is 100%, which is normal. Head is normocephalic and atraumatic. PERRLA, EOMI. Oropharynx is clear. Neck is nontender and supple without adenopathy or JVD. Back is nontender.  There is mild right paralumbar spasm and moderate left paralumbar spasm.  Straight leg raise is positive bilaterally at 45 degrees.  There is no CVA tenderness. Lungs are clear without rales, wheezes, or rhonchi. Chest is nontender. Heart has regular rate and rhythm without murmur. Abdomen is soft, flat, nontender without masses or hepatosplenomegaly and peristalsis is normoactive. Extremities have no cyanosis or edema, full range of motion is present. Skin is warm and dry  without rash. Neurologic: Mental status is normal, cranial nerves are intact, there are no motor or sensory deficits.  ED Treatments / Results   Procedures Procedures   Medications Ordered in ED Medications  ibuprofen (ADVIL,MOTRIN) tablet 800 mg (has no administration in time range)  cyclobenzaprine (FLEXERIL) tablet 10 mg (has no administration in time range)  HYDROcodone-acetaminophen (NORCO/VICODIN) 5-325 MG per tablet 1 tablet (has no administration in time range)     Initial Impression / Assessment and Plan / ED Course  I have reviewed the triage vital signs and the nursing notes.  Low back pain which appears to be musculoskeletal.  No red flags to suggest more serious pathology.  Old records are  reviewed, and he has several prior ED visits for low back pain.  No indication for imaging today.  He is given prescriptions for naproxen, cyclobenzaprine and also given prescription for small number of hydrocodone-acetaminophen tablets.  His record on the [place] controlled substance reporting website was reviewed, and he has not had any narcotic prescriptions in several years.  He is advised on appropriate posture and lifting techniques and was given back exercises to do once he starts to feel better.  Final Clinical Impressions(s) / ED Diagnoses   Final diagnoses:  Acute bilateral low back pain without sciatica    ED Discharge Orders         Ordered    naproxen (NAPROSYN) 500 MG tablet  2 times daily     05/27/18 2322    cyclobenzaprine (FLEXERIL) 10 MG tablet  3 times daily PRN,   Status:  Discontinued     05/27/18 2322    HYDROcodone-acetaminophen (NORCO) 5-325 MG tablet  Every 4 hours PRN,   Status:  Discontinued     05/27/18 2322    cyclobenzaprine (FLEXERIL) 10 MG tablet  3 times daily PRN     05/27/18 2323    HYDROcodone-acetaminophen (NORCO) 5-325 MG tablet  Every 4 hours PRN,   Status:  Discontinued     05/27/18 2323    HYDROcodone-acetaminophen (NORCO) 5-325 MG tablet  Every 4 hours PRN     05/27/18 2325           Dione Booze, MD 05/27/18 2329

## 2022-03-28 ENCOUNTER — Ambulatory Visit (HOSPITAL_COMMUNITY)
Admission: EM | Admit: 2022-03-28 | Discharge: 2022-03-28 | Disposition: A | Discharge status: Home / Self Care | Payer: Managed Care, Other (non HMO) | Attending: Physician Assistant | Admitting: Physician Assistant

## 2022-03-28 ENCOUNTER — Encounter (HOSPITAL_COMMUNITY): Payer: Self-pay | Admitting: Emergency Medicine

## 2022-03-28 ENCOUNTER — Other Ambulatory Visit: Payer: Self-pay

## 2022-03-28 DIAGNOSIS — J069 Acute upper respiratory infection, unspecified: Secondary | ICD-10-CM | POA: Diagnosis not present

## 2022-03-28 DIAGNOSIS — Z1152 Encounter for screening for COVID-19: Secondary | ICD-10-CM

## 2022-03-28 DIAGNOSIS — R509 Fever, unspecified: Secondary | ICD-10-CM | POA: Diagnosis present

## 2022-03-28 DIAGNOSIS — J029 Acute pharyngitis, unspecified: Secondary | ICD-10-CM | POA: Diagnosis not present

## 2022-03-28 DIAGNOSIS — J028 Acute pharyngitis due to other specified organisms: Secondary | ICD-10-CM | POA: Diagnosis present

## 2022-03-28 LAB — RESP PANEL BY RT-PCR (FLU A&B, COVID) ARPGX2
Influenza A by PCR: NEGATIVE
Influenza B by PCR: NEGATIVE
SARS Coronavirus 2 by RT PCR: NEGATIVE

## 2022-03-28 LAB — POCT RAPID STREP A, ED / UC: Streptococcus, Group A Screen (Direct): NEGATIVE

## 2022-03-28 NOTE — Discharge Instructions (Addendum)
Advised to continue taking Tylenol and ibuprofen to help control symptoms along with OTC cold medications. COVID and flu test should be completed within 24 hours or less, if you do not get a call from this office that indicates the labs are negative.  He can go on MyChart to view the results when they post in less than 24 hours. Advised to follow-up PCP or return to urgent care if symptoms fail to improve.

## 2022-03-28 NOTE — ED Provider Notes (Signed)
[facility]    CSN: [id number] Arrival date & time: 03/28/22  0807      History   Chief Complaint Chief Complaint  Patient presents with   Cough    HPI Benjamin Hudson is a 31 y.o. male.   31 year old male presents with sore throat, fever, body aches.  Patient indicates for the past 3 days he has been having progressive sore throat, painful swallowing.  He also relates having upper respiratory congestion with rhinitis mainly clear production, chest congestion with intermittent cough production being clear.  Patient also relates that he has been having fever 99-100, chills, body aches and pains, sweats.  He indicates that yesterday he had nausea with an episode of vomiting, and some intermittent diarrhea.  Patient is concerned about having either strep or COVID.  He relates he has not been around any family or friends that have been sick with similar symptoms, he does work in a warehouse, and has been out and about in the public on a regular basis over the past several days.  He relates not being vaccinated against COVID.  He is tolerating fluids well, is taking some Tylenol and ibuprofen for pain relief and fever, along with taking some OTC cold medicines to control his symptoms.   Cough Associated symptoms: chills, fever and sore throat     Past Medical History:  Diagnosis Date   Asthma    Seasonal allergies     There are no problems to display for this patient.   History reviewed. No pertinent surgical history.     Home Medications    Prior to Admission medications   Medication Sig Start Date End Date Taking? Authorizing Provider  cyclobenzaprine (FLEXERIL) 10 MG tablet Take 1 tablet (10 mg total) by mouth 3 (three) times daily as needed for muscle spasms. Patient not taking: Reported on 03/28/2022 05/27/18   Dione Booze, MD  HYDROcodone-acetaminophen [facility]) 5-325 MG tablet Take 1 tablet by mouth every 4 (four) hours as needed for moderate pain. Patient not  taking: Reported on 03/28/2022 05/27/18   Dione Booze, MD  naproxen (NAPROSYN) 500 MG tablet Take 1 tablet (500 mg total) by mouth 2 (two) times daily. Patient not taking: Reported on 03/28/2022 05/27/18   Dione Booze, MD    Family History Family History  Problem Relation Age of Onset   Diabetes Mother     Social History Social History   Tobacco Use   Smoking status: Never   Smokeless tobacco: Never  Vaping Use   Vaping Use: Never used  Substance Use Topics   Alcohol use: Yes   Drug use: No     Allergies   Patient has no known allergies.   Review of Systems Review of Systems  Constitutional:  Positive for chills, fatigue and fever.  HENT:  Positive for sore throat.   Respiratory:  Positive for cough.      Physical Exam Triage Vital Signs ED Triage Vitals  Enc Vitals Group     BP 03/28/22 0858 (!) 160/94     Pulse Rate 03/28/22 0858 (!) 102     Resp 03/28/22 0858 20     Temp 03/28/22 0858 99.5 F (37.5 C)     Temp Source 03/28/22 0858 Oral     SpO2 03/28/22 0858 96 %     Weight --      Height --      Head Circumference --      Peak Flow --  Pain Score 03/28/22 0854 6     Pain Loc --      Pain Edu? --      Excl. in GC? --    No data found.  Updated Vital Signs BP (!) 160/94 (BP Location: Left Arm) Comment: has had otc cold and flu medicine today  Pulse (!) 102   Temp 99.5 F (37.5 C) (Oral)   Resp 20   SpO2 96%   Visual Acuity Right Eye Distance:   Left Eye Distance:   Bilateral Distance:    Right Eye Near:   Left Eye Near:    Bilateral Near:     Physical Exam Constitutional:      Appearance: Normal appearance.  HENT:     Right Ear: Ear canal normal. Tympanic membrane is injected.     Left Ear: Ear canal normal. Tympanic membrane is injected.     Mouth/Throat:     Mouth: Mucous membranes are moist.     Pharynx: Oropharynx is clear. Uvula midline. Posterior oropharyngeal erythema present. No oropharyngeal exudate.  Cardiovascular:      Rate and Rhythm: Normal rate and regular rhythm.     Heart sounds: Normal heart sounds.  Pulmonary:     Effort: Pulmonary effort is normal.     Breath sounds: Normal air entry. Decreased breath sounds present. No wheezing, rhonchi or rales.  Abdominal:     General: Abdomen is flat. Bowel sounds are normal.     Palpations: Abdomen is soft.     Tenderness: There is no abdominal tenderness.  Lymphadenopathy:     Cervical: No cervical adenopathy.  Neurological:     Mental Status: He is alert.      UC Treatments / Results  Labs (all labs ordered are listed, but only abnormal results are displayed) Labs Reviewed  RESP PANEL BY RT-PCR (FLU A&B, COVID) ARPGX2  CULTURE, GROUP A STREP [facility])  POCT RAPID STREP A, ED / UC    EKG   Radiology No results found.  Procedures Procedures (including critical care time)  Medications Ordered in UC Medications - No data to display  Initial Impression / Assessment and Plan / UC Course  I have reviewed the triage vital signs and the nursing notes.  Pertinent labs & imaging results that were available during my care of the patient were reviewed by me and considered in my medical decision making (see chart for details).    Plan: 1.  COVID and flu test are pending. 2.  Advised to continue taking Tylenol or ibuprofen to help control fever, body aches. 3.  Advised to continue taking OTC cold medications to control symptoms. 4.  Advised to follow-up PCP or return to urgent care if symptoms fail to improve. 5.  Throat culture is pending. Final Clinical Impressions(s) / UC Diagnoses   Final diagnoses:  Viral upper respiratory tract infection  Acute pharyngitis due to other specified organisms  Fever, unspecified  Encounter for screening for COVID-19     Discharge Instructions      Advised to continue taking Tylenol and ibuprofen to help control symptoms along with OTC cold medications. COVID and flu test should be completed within 24  hours or less, if you do not get a call from this office that indicates the labs are negative.  He can go on MyChart to view the results when they post in less than 24 hours. Advised to follow-up PCP or return to urgent care if symptoms fail to improve.  ED Prescriptions   None    PDMP not reviewed this encounter.   Ellsworth Lennox, PA-C 03/28/22 1003

## 2022-03-28 NOTE — ED Triage Notes (Signed)
Complains of headache, cough, body aches.  Has had nausea.  Onset Monday of symptoms.  Patient will need a work note.    Patient has taken ibuprofen and a nasal spray, OTC cold and flu medicine.

## 2022-03-28 NOTE — ED Notes (Signed)
Strep swab and covid swab in lab

## 2022-03-30 LAB — CULTURE, GROUP A STREP (THRC)

## 2022-10-12 ENCOUNTER — Emergency Department (HOSPITAL_BASED_OUTPATIENT_CLINIC_OR_DEPARTMENT_OTHER)
Admission: EM | Admit: 2022-10-12 | Discharge: 2022-10-12 | Disposition: A | Discharge status: Home / Self Care | Payer: 59 | Attending: Emergency Medicine | Admitting: Emergency Medicine

## 2022-10-12 ENCOUNTER — Other Ambulatory Visit: Payer: Self-pay

## 2022-10-12 ENCOUNTER — Encounter (HOSPITAL_BASED_OUTPATIENT_CLINIC_OR_DEPARTMENT_OTHER): Payer: Self-pay | Admitting: Emergency Medicine

## 2022-10-12 DIAGNOSIS — R110 Nausea: Secondary | ICD-10-CM | POA: Diagnosis not present

## 2022-10-12 DIAGNOSIS — M25512 Pain in left shoulder: Secondary | ICD-10-CM | POA: Diagnosis not present

## 2022-10-12 DIAGNOSIS — J45909 Unspecified asthma, uncomplicated: Secondary | ICD-10-CM | POA: Diagnosis not present

## 2022-10-12 DIAGNOSIS — R197 Diarrhea, unspecified: Secondary | ICD-10-CM | POA: Diagnosis not present

## 2022-10-12 DIAGNOSIS — R1030 Lower abdominal pain, unspecified: Secondary | ICD-10-CM | POA: Diagnosis present

## 2022-10-12 DIAGNOSIS — R748 Abnormal levels of other serum enzymes: Secondary | ICD-10-CM | POA: Insufficient documentation

## 2022-10-12 LAB — COMPREHENSIVE METABOLIC PANEL
ALT: 38 U/L (ref 0–44)
AST: 24 U/L (ref 15–41)
Albumin: 4.2 g/dL (ref 3.5–5.0)
Alkaline Phosphatase: 73 U/L (ref 38–126)
Anion gap: 6 (ref 5–15)
BUN: 15 mg/dL (ref 6–20)
CO2: 26 mmol/L (ref 22–32)
Calcium: 8.9 mg/dL (ref 8.9–10.3)
Chloride: 102 mmol/L (ref 98–111)
Creatinine, Ser: 1.25 mg/dL — ABNORMAL HIGH (ref 0.61–1.24)
GFR, Estimated: >60 mL/min (ref >60)
Glucose, Bld: 98 mg/dL (ref 70–99)
Potassium: 4 mmol/L (ref 3.5–5.1)
Sodium: 134 mmol/L — ABNORMAL LOW (ref 135–145)
Total Bilirubin: 0.9 mg/dL (ref 0.3–1.2)
Total Protein: 8 g/dL (ref 6.5–8.1)

## 2022-10-12 LAB — URINALYSIS, MICROSCOPIC (REFLEX)

## 2022-10-12 LAB — CBC
HCT: 48.2 % (ref 39.0–52.0)
Hemoglobin: 16.3 g/dL (ref 13.0–17.0)
MCH: 30.1 pg (ref 26.0–34.0)
MCHC: 33.8 g/dL (ref 30.0–36.0)
MCV: 88.9 fL (ref 80.0–100.0)
Platelets: 191 10*3/uL (ref 150–400)
RBC: 5.42 MIL/uL (ref 4.22–5.81)
RDW: 12.5 % (ref 11.5–15.5)
WBC: 3.5 10*3/uL — ABNORMAL LOW (ref 4.0–10.5)
nRBC: 0 % (ref 0.0–0.2)

## 2022-10-12 LAB — URINALYSIS, ROUTINE W REFLEX MICROSCOPIC
Bilirubin Urine: NEGATIVE
Glucose, UA: NEGATIVE mg/dL
Ketones, ur: NEGATIVE mg/dL
Leukocytes,Ua: NEGATIVE
Nitrite: NEGATIVE
Protein, ur: NEGATIVE mg/dL
Specific Gravity, Urine: >=1.03 (ref 1.005–1.030)
pH: 5 (ref 5.0–8.0)

## 2022-10-12 LAB — LIPASE, BLOOD: Lipase: 66 U/L — ABNORMAL HIGH (ref 11–51)

## 2022-10-12 MED ORDER — METHOCARBAMOL 500 MG PO TABS
500.0000 mg | ORAL_TABLET | Freq: Three times a day (TID) | ORAL | 0 refills | Status: DC | PRN
  Filled 2022-10-12: qty 8, 3d supply, fill #0

## 2022-10-12 MED ORDER — ONDANSETRON 4 MG PO TBDP: 4.0000 mg | ORAL_TABLET | Freq: Three times a day (TID) | ORAL | 0 refills | Status: AC | PRN

## 2022-10-12 MED ORDER — METHOCARBAMOL 500 MG PO TABS
500.0000 mg | ORAL_TABLET | Freq: Once | ORAL | Status: AC
  Administered 2022-10-12: 500 mg via ORAL
  Filled 2022-10-12: qty 1

## 2022-10-12 MED ORDER — ONDANSETRON 4 MG PO TBDP
4.0000 mg | ORAL_TABLET | Freq: Once | ORAL | Status: AC
  Administered 2022-10-12: 4 mg via ORAL
  Filled 2022-10-12: qty 1

## 2022-10-12 MED ORDER — ONDANSETRON 4 MG PO TBDP
4.0000 mg | ORAL_TABLET | Freq: Three times a day (TID) | ORAL | 0 refills | Status: DC | PRN
  Filled 2022-10-12: qty 8, 3d supply, fill #0

## 2022-10-12 MED ORDER — METHOCARBAMOL 500 MG PO TABS: 500.0000 mg | ORAL_TABLET | Freq: Three times a day (TID) | ORAL | 0 refills | Status: AC | PRN

## 2022-10-12 NOTE — Discharge Instructions (Signed)
Your lipase was mildly elevated.  Follow-up as an outpatient.  Try and keep yourself hydrated.  Return for worsening symptoms.  The muscle relaxers should also help with the shoulder pain.

## 2022-10-12 NOTE — ED Provider Notes (Signed)
[place] [facility] [facility] Provider Note   CSN: [id number] Arrival date & time: 10/12/22  1507     History  Chief Complaint  Patient presents with   Abdominal Pain    Benjamin Hudson is a 32 y.o. male.   Abdominal Pain Patient presents with abdominal pain.  Mostly lower abdomen.  Has had some diarrhea and nausea.  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  Mild abdominal pain without fevers.  No blood in the diarrhea. Also left shoulder pain.  States it feels muscular behind the shoulder.  No trauma.  States he does twist a lot at work but did not necessarily injure it.    Past Medical History:  Diagnosis Date   Asthma    Seasonal allergies     Home Medications Prior to Admission medications   Medication Sig Start Date End Date Taking? Authorizing Provider  HYDROcodone-acetaminophen (NORCO) 5-325 MG tablet Take 1 tablet by mouth every 4 (four) hours as needed for moderate pain. Patient not taking: Reported on [id number]   Delora Fuel, MD  methocarbamol (ROBAXIN) 500 MG tablet Take 1 tablet (500 mg total) by mouth every 8 (eight) hours as needed for muscle spasms. 10/12/22   Davonna Belling, MD  naproxen (NAPROSYN) 500 MG tablet Take 1 tablet (500 mg total) by mouth 2 (two) times daily. Patient not taking: Reported on [id number]   Delora Fuel, MD  ondansetron (ZOFRAN-ODT) 4 MG disintegrating tablet Take 1 tablet (4 mg total) by mouth every 8 (eight) hours as needed for nausea or vomiting. 10/12/22   Davonna Belling, MD      Allergies    Patient has no known allergies.    Review of Systems   Review of Systems  Gastrointestinal:  Positive for abdominal pain.    Physical Exam Updated Vital Signs BP 139/86   Pulse 77   Temp 98.3 F (36.8 C) (Oral)   Resp 16   Ht 6' (1.829 m)   Wt 90.7 kg   SpO2 96%   BMI 27.12 kg/m  Physical Exam Vitals and nursing note reviewed.  Cardiovascular:     Rate  and Rhythm: Normal rate and regular rhythm.  Pulmonary:     Breath sounds: Normal breath sounds.  Abdominal:     Hernia: No hernia is present.     Comments: Mild lower abdominal tenderness.  No rebound or guarding.  No hernias palpated.  No left upper quadrant tenderness.  Skin:    Capillary Refill: Capillary refill takes less than 2 seconds.  Neurological:     Mental Status: He is alert and oriented to person, place, and time.   Mild tenderness over musculature medial to left scapula.    ED Results / Procedures / Treatments   Labs (all labs ordered are listed, but only abnormal results are displayed) Labs Reviewed  LIPASE, BLOOD - Abnormal; Notable for the following components:      Result Value   Lipase 66 (*)    All other components within normal limits  COMPREHENSIVE METABOLIC PANEL - Abnormal; Notable for the following components:   Sodium 134 (*)    Creatinine, Ser 1.25 (*)    All other components within normal limits  CBC - Abnormal; Notable for the following components:   WBC 3.5 (*)    All other components within normal limits  URINALYSIS, ROUTINE W REFLEX MICROSCOPIC - Abnormal; Notable for the following components:   Hgb urine dipstick  TRACE (*)    All other components within normal limits  URINALYSIS, MICROSCOPIC (REFLEX) - Abnormal; Notable for the following components:   Bacteria, UA FEW (*)    All other components within normal limits    EKG EKG Interpretation  Date/Time:  Friday October 12 2022 15:42:14 EDT Ventricular Rate:  89 PR Interval:  132 QRS Duration: 83 QT Interval:  340 QTC Calculation: 414 R Axis:   19 Text Interpretation: Sinus rhythm Ventricular premature complex Borderline T abnormalities, inferior leads Borderline ST elevation, lateral leads No significant change since last tracing Confirmed by Davonna Belling [id number]) on 10/12/2022 6:25:21 PM  Radiology No results found.  Procedures Procedures    Medications Ordered in  ED Medications  ondansetron (ZOFRAN-ODT) disintegrating tablet 4 mg (4 mg Oral Given 10/12/22 1839)  methocarbamol (ROBAXIN) tablet 500 mg (500 mg Oral Given 10/12/22 1839)    ED Course/ Medical Decision Making/ A&P                             Medical Decision Making Amount and/or Complexity of Data Reviewed Labs: ordered.  Risk Prescription drug management.   Patient abdominal pain nausea and some diarrhea.  Benign exam.  Lab work reassuring.  Lipase is mildly elevated but no left upper quadrant tenderness.  Rarely drinks alcohol.  Can be followed as an outpatient.  Doubt severe pancreatitis.  Also pain behind left shoulder.  Appears musculoskeletal.  Do not think x-ray would really add much at this point.  Muscle relaxers as an outpatient.  Urine showed some signs of infection but no dysuria.  No treatment for UTI.  Will discharge home with outpatient follow-up. Follow-up instructions given for abdominal pain if needed.       Final Clinical Impression(s) / ED Diagnoses Final diagnoses:  Lower abdominal pain  Diarrhea, unspecified type  Acute pain of left shoulder    Rx / DC Orders ED Discharge Orders          Ordered    methocarbamol (ROBAXIN) 500 MG tablet  Every 8 hours PRN,   Status:  Discontinued        10/12/22 1900    ondansetron (ZOFRAN-ODT) 4 MG disintegrating tablet  Every 8 hours PRN,   Status:  Discontinued        10/12/22 1900    methocarbamol (ROBAXIN) 500 MG tablet  Every 8 hours PRN        10/12/22 1903    ondansetron (ZOFRAN-ODT) 4 MG disintegrating tablet  Every 8 hours PRN        10/12/22 1903              Davonna Belling, MD 10/12/22 1905

## 2022-10-12 NOTE — ED Notes (Signed)
Reviewed discharge instructions and recommendations with pt. States understanding 

## 2022-10-12 NOTE — ED Triage Notes (Signed)
Pt arrives pov, steady gait with c/o LT shoulder pain x 3 days, and generalized ABD pain with nausea and diarrhea today. Pt denies CP or shob

## 2022-10-14 ENCOUNTER — Other Ambulatory Visit (HOSPITAL_BASED_OUTPATIENT_CLINIC_OR_DEPARTMENT_OTHER): Payer: Self-pay

## 2022-10-25 ENCOUNTER — Other Ambulatory Visit: Payer: Self-pay | Admitting: Physician Assistant

## 2022-10-25 DIAGNOSIS — R748 Abnormal levels of other serum enzymes: Secondary | ICD-10-CM

## 2022-10-25 DIAGNOSIS — R140 Abdominal distension (gaseous): Secondary | ICD-10-CM

## 2022-10-25 DIAGNOSIS — R1013 Epigastric pain: Secondary | ICD-10-CM

## 2022-11-06 ENCOUNTER — Ambulatory Visit
Admission: RE | Admit: 2022-11-06 | Discharge: 2022-11-06 | Disposition: A | Discharge status: Home / Self Care | Payer: 59 | Source: Ambulatory Visit | Attending: Physician Assistant | Admitting: Physician Assistant

## 2022-11-06 ENCOUNTER — Other Ambulatory Visit: Payer: Self-pay | Admitting: Physician Assistant

## 2022-11-06 DIAGNOSIS — R1013 Epigastric pain: Secondary | ICD-10-CM

## 2022-11-06 DIAGNOSIS — R748 Abnormal levels of other serum enzymes: Secondary | ICD-10-CM

## 2022-11-06 DIAGNOSIS — R140 Abdominal distension (gaseous): Secondary | ICD-10-CM

## 2022-11-06 MED ORDER — IOPAMIDOL (ISOVUE-300) INJECTION 61%: 100.0000 mL | Freq: Once | INTRAVENOUS | Status: DC | PRN

## 2022-11-06 MED ORDER — IOPAMIDOL (ISOVUE-300) INJECTION 61%
100.0000 mL | Freq: Once | INTRAVENOUS | Status: AC | PRN
  Administered 2022-11-06: 100 mL via INTRAVENOUS

## 2022-11-30 ENCOUNTER — Other Ambulatory Visit: Payer: Self-pay

## 2023-09-24 ENCOUNTER — Other Ambulatory Visit: Payer: Self-pay

## 2023-09-24 ENCOUNTER — Encounter (HOSPITAL_BASED_OUTPATIENT_CLINIC_OR_DEPARTMENT_OTHER): Payer: Self-pay

## 2023-09-24 ENCOUNTER — Emergency Department (HOSPITAL_BASED_OUTPATIENT_CLINIC_OR_DEPARTMENT_OTHER)
Admission: EM | Admit: 2023-09-24 | Discharge: 2023-09-24 | Disposition: A | Discharge status: Home / Self Care | Attending: Emergency Medicine | Admitting: Emergency Medicine

## 2023-09-24 DIAGNOSIS — U071 COVID-19: Secondary | ICD-10-CM | POA: Insufficient documentation

## 2023-09-24 DIAGNOSIS — R059 Cough, unspecified: Secondary | ICD-10-CM | POA: Diagnosis present

## 2023-09-24 LAB — GROUP A STREP BY PCR: Group A Strep by PCR: NOT DETECTED

## 2023-09-24 LAB — RESP PANEL BY RT-PCR (RSV, FLU A&B, COVID)  RVPGX2
Influenza A by PCR: NEGATIVE
Influenza B by PCR: NEGATIVE
Resp Syncytial Virus by PCR: NEGATIVE
SARS Coronavirus 2 by RT PCR: POSITIVE — AB

## 2023-09-24 NOTE — ED Provider Notes (Signed)
 [place] [facility] Provider Note   CSN: [id number] Arrival date & time: 09/24/23  1608     History  Chief Complaint  Patient presents with   URI   Sore Throat   HPI Benjamin Hudson is a 33 y.o. male presenting for headache, congestion body aches and nonproductive cough.  Symptoms started today.  Denies chest pain shortness of breath.   URI Sore Throat       Home Medications Prior to Admission medications   Medication Sig Start Date End Date Taking? Authorizing Provider  HYDROcodone-acetaminophen (NORCO) 5-325 MG tablet Take 1 tablet by mouth every 4 (four) hours as needed for moderate pain. Patient not taking: Reported on 03/28/2022 05/27/18   Dione Booze, MD  methocarbamol (ROBAXIN) 500 MG tablet Take 1 tablet (500 mg total) by mouth every 8 (eight) hours as needed for muscle spasms. 10/12/22   Benjiman Core, MD  naproxen (NAPROSYN) 500 MG tablet Take 1 tablet (500 mg total) by mouth 2 (two) times daily. Patient not taking: Reported on 03/28/2022 05/27/18   Dione Booze, MD  ondansetron (ZOFRAN-ODT) 4 MG disintegrating tablet Take 1 tablet (4 mg total) by mouth every 8 (eight) hours as needed for nausea or vomiting. 10/12/22   Benjiman Core, MD      Allergies    Patient has no known allergies.    Review of Systems   See HPI   Physical Exam   Vitals:   09/24/23 1640  BP: (!) 143/96  Pulse: (!) 103  Resp: 18  Temp: 98.5 F (36.9 C)  SpO2: 98%    CONSTITUTIONAL:  well-appearing, NAD NEURO:  Alert and oriented x 3, CN 3-12 grossly intact EYES:  eyes equal and reactive ENT/NECK:  Supple, no stridor  CARDIO:  regular rate and rhythm, appears well-perfused  PULM:  No respiratory distress, CTAB GI/GU:  non-distended MSK/SPINE:  No gross deformities, no edema, moves all extremities  SKIN:  no rash, atraumatic   *Additional and/or pertinent findings included in MDM below    ED Results / Procedures / Treatments    Labs (all labs ordered are listed, but only abnormal results are displayed) Labs Reviewed  RESP PANEL BY RT-PCR (RSV, FLU A&B, COVID)  RVPGX2 - Abnormal; Notable for the following components:      Result Value   SARS Coronavirus 2 by RT PCR POSITIVE (*)    All other components within normal limits  GROUP A STREP BY PCR    EKG None  Radiology No results found.  Procedures Procedures    Medications Ordered in ED Medications - No data to display  ED Course/ Medical Decision Making/ A&P                                 Medical Decision Making  33 year old well-appearing male presenting for URI symptoms. Exam was unremarkable. Respiratory PCR was positive for COVID. This is likely etiology of his symptoms. Overall looks well, nontoxic and no acute distress.  Advised supportive treatment at home.  Advised to follow-up with his PCP if symptoms persisted.  Discussed.  Return precautions.  Discharged in good condition.        Final Clinical Impression(s) / ED Diagnoses Final diagnoses:  COVID-19    Rx / DC Orders ED Discharge Orders     None         Gareth Eagle, PA-C 09/24/23 1803  Franne Forts, DO 09/24/23 [phone]

## 2023-09-24 NOTE — ED Triage Notes (Signed)
 Pt presents with headache, congestion, body aches and nonproductive cough that started yesterday.

## 2023-09-24 NOTE — Discharge Instructions (Addendum)
 Evaluation today revealed you have COVID.  This likely explains your symptoms.  Treatment is supportive at home which includes rest, assertive hydration with water and Gatorade, and you can take Tylenol and ibuprofen as needed for symptomatic relief and fever control.  Please follow-up with your PCP if her symptoms persist.  If you develop chest pain, shortness of breath, uncontrolled fever, lethargy or any other concerning symptom please return emerged department for further evaluation.

## 2023-09-24 NOTE — ED Notes (Signed)
 Discharge instructions reviewed with patient. Patient verbalizes understanding, no further questions at this time. Medications and follow up information provided. No acute distress noted at time of departure.

## 2024-02-20 ENCOUNTER — Encounter (HOSPITAL_BASED_OUTPATIENT_CLINIC_OR_DEPARTMENT_OTHER): Payer: Self-pay | Admitting: Emergency Medicine

## 2024-02-20 ENCOUNTER — Other Ambulatory Visit: Payer: Self-pay

## 2024-02-20 ENCOUNTER — Emergency Department (HOSPITAL_BASED_OUTPATIENT_CLINIC_OR_DEPARTMENT_OTHER)
Admission: EM | Admit: 2024-02-20 | Discharge: 2024-02-20 | Disposition: A | Discharge status: Home / Self Care | Payer: Self-pay | Attending: Emergency Medicine | Admitting: Emergency Medicine

## 2024-02-20 DIAGNOSIS — J029 Acute pharyngitis, unspecified: Secondary | ICD-10-CM

## 2024-02-20 DIAGNOSIS — J020 Streptococcal pharyngitis: Secondary | ICD-10-CM | POA: Insufficient documentation

## 2024-02-20 LAB — RESP PANEL BY RT-PCR (RSV, FLU A&B, COVID)  RVPGX2
Influenza A by PCR: NEGATIVE
Influenza B by PCR: NEGATIVE
Resp Syncytial Virus by PCR: NEGATIVE
SARS Coronavirus 2 by RT PCR: NEGATIVE

## 2024-02-20 LAB — GROUP A STREP BY PCR: Group A Strep by PCR: NOT DETECTED

## 2024-02-20 MED ORDER — AMOXICILLIN 500 MG PO CAPS: 500.0000 mg | ORAL_CAPSULE | Freq: Two times a day (BID) | ORAL | 0 refills | Status: AC

## 2024-02-20 MED ORDER — AMOXICILLIN 500 MG PO CAPS
500.0000 mg | ORAL_CAPSULE | Freq: Once | ORAL | Status: AC
  Administered 2024-02-20: 500 mg via ORAL
  Filled 2024-02-20: qty 1

## 2024-02-20 NOTE — ED Triage Notes (Signed)
 Pt POV c/o sore throat x 2 days, painful to swallow.    Denies known fever, cough.    Took advil  appx 0700 today.

## 2024-02-20 NOTE — ED Provider Notes (Signed)
 [facility] Provider Note   CSN: [id number] Arrival date & time: 02/20/24  1203     Patient presents with: Sore Throat   Benjamin Hudson is a 33 y.o. male.   33 year old male presents to ED for complaints of sore throat since last night.  Patient denies any recent sick contacts but reports he is around a lot of people at work.  Patient reports symptoms started last night and has gotten worse progressively today.  Patient denies any fever, chills, cough, abdominal pain, nausea/vomiting, or headache.  Patient denies any medication past medical history.  Patient reports no known allergies.   Sore Throat Pertinent negatives include no shortness of breath.       Prior to Admission medications   Medication Sig Start Date End Date Taking? Authorizing Provider  amoxicillin  (AMOXIL ) 500 MG capsule Take 1 capsule (500 mg total) by mouth 2 (two) times daily. 02/20/24  Yes Myriam Fonda RAMAN, PA-C  HYDROcodone -acetaminophen  (NORCO) 5-325 MG tablet Take 1 tablet by mouth every 4 (four) hours as needed for moderate pain. Patient not taking: Reported on 03/28/2022 05/27/18   Raford Lenis, MD  methocarbamol  (ROBAXIN ) 500 MG tablet Take 1 tablet (500 mg total) by mouth every 8 (eight) hours as needed for muscle spasms. 10/12/22   Patsey Lot, MD  naproxen  (NAPROSYN ) 500 MG tablet Take 1 tablet (500 mg total) by mouth 2 (two) times daily. Patient not taking: Reported on 03/28/2022 05/27/18   Raford Lenis, MD  ondansetron  (ZOFRAN -ODT) 4 MG disintegrating tablet Take 1 tablet (4 mg total) by mouth every 8 (eight) hours as needed for nausea or vomiting. 10/12/22   Patsey Lot, MD    Allergies: Patient has no known allergies.    Review of Systems  Constitutional:  Negative for chills, fatigue and fever.  HENT:  Positive for sore throat. Negative for dental problem, drooling, ear discharge, ear pain, postnasal drip, rhinorrhea, sinus pressure and sinus  pain.   Respiratory: Negative.  Negative for shortness of breath, wheezing and stridor.   Cardiovascular: Negative.   Gastrointestinal:  Negative for nausea.  Neurological:  Negative for dizziness, tremors, weakness and light-headedness.    Updated Vital Signs BP (!) 152/99 (BP Location: Left Arm)   Pulse 84   Temp 99 F (37.2 C) (Oral)   Resp 16   Ht 6' (1.829 m)   SpO2 100%   BMI 27.12 kg/m   Physical Exam Constitutional:      Appearance: He is well-developed.  HENT:     Mouth/Throat:     Tonsils: Tonsillar exudate present. No tonsillar abscesses. 2+ on the right. 2+ on the left.  Eyes:     Conjunctiva/sclera: Conjunctivae normal.     Pupils: Pupils are equal, round, and reactive to light.  Cardiovascular:     Rate and Rhythm: Normal rate.  Pulmonary:     Effort: Pulmonary effort is normal. No respiratory distress.     Breath sounds: Normal breath sounds. No stridor. No wheezing or rhonchi.  Musculoskeletal:     Cervical back: Normal range of motion.  Skin:    General: Skin is warm and dry.  Neurological:     Mental Status: He is alert.     (all labs ordered are listed, but only abnormal results are displayed) Labs Reviewed  GROUP A STREP BY PCR  RESP PANEL BY RT-PCR (RSV, FLU A&B, COVID)  [id number]    EKG: None  Radiology: No results found.  Procedures   Medications Ordered in the ED  amoxicillin  (AMOXIL ) capsule 500 mg (500 mg Oral Given 02/20/24 1411)                                 Medical Decision Making 33 year old male presents to ED with complaints of 1 day of sore throat.  Rapid strep was negative, Centor score was 3.  Patient reported he has not tried any medications at home.  On exam patient had bilateral tonsillar exudate and swelling.  Patient had tender to palpation tonsillar lymph node.  Patient denied any shortness of breath, difficulty swallowing, difficulty eating, or drooling.  Other etiologies were discussed with patient, patient  agreed to trial antibiotic course.  Patient reported to use NSAIDs, drink plenty fluids, rest.   Patient was advised if he became short of breath, or symptoms did not relieve after the course of antibiotics, to seek further medical evaluation.  Risk Prescription drug management.    Final diagnoses:  Pharyngitis due to Streptococcus species  Sore throat    ED Discharge Orders          Ordered    amoxicillin  (AMOXIL ) 500 MG capsule  2 times daily        02/20/24 1357               Myriam Fonda RAMAN, PA-C 02/20/24 1451    Elnor Bernarda SQUIBB, DO 02/28/24 2333
